# Patient Record
Sex: Female | Born: 1990 | Race: Black or African American | Hispanic: No | Marital: Single | Smoking: Never smoker
Health system: Southern US, Community
[De-identification: ages and names within clinical notes are randomized; demographics above are authoritative.]

## PROBLEM LIST (undated history)

## (undated) DIAGNOSIS — O139 Gestational [pregnancy-induced] hypertension without significant proteinuria, unspecified trimester: Secondary | ICD-10-CM

## (undated) DIAGNOSIS — J45909 Unspecified asthma, uncomplicated: Secondary | ICD-10-CM

## (undated) DIAGNOSIS — I10 Essential (primary) hypertension: Secondary | ICD-10-CM

## (undated) DIAGNOSIS — Z8619 Personal history of other infectious and parasitic diseases: Secondary | ICD-10-CM

## (undated) DIAGNOSIS — O24419 Gestational diabetes mellitus in pregnancy, unspecified control: Secondary | ICD-10-CM

## (undated) HISTORY — DX: Personal history of other infectious and parasitic diseases: Z86.19

## (undated) HISTORY — PX: JOINT REPLACEMENT: SHX530

## (undated) HISTORY — PX: KNEE SURGERY: SHX244

---

## 2012-07-14 ENCOUNTER — Emergency Department (INDEPENDENT_AMBULATORY_CARE_PROVIDER_SITE_OTHER)
Admission: EM | Admit: 2012-07-14 | Discharge: 2012-07-14 | Disposition: A | Discharge status: Home / Self Care | Payer: Medicaid Other | Source: Home / Self Care | Attending: Emergency Medicine | Admitting: Emergency Medicine

## 2012-07-14 ENCOUNTER — Encounter (HOSPITAL_COMMUNITY): Payer: Self-pay | Admitting: Emergency Medicine

## 2012-07-14 DIAGNOSIS — A749 Chlamydial infection, unspecified: Secondary | ICD-10-CM

## 2012-07-14 LAB — WET PREP, GENITAL
Clue Cells Wet Prep HPF POC: NONE SEEN
Trich, Wet Prep: NONE SEEN
Yeast Wet Prep HPF POC: NONE SEEN

## 2012-07-14 MED ORDER — AZITHROMYCIN 250 MG PO TABS
ORAL_TABLET | ORAL | Status: AC
  Filled 2012-07-14: qty 4

## 2012-07-14 MED ORDER — AZITHROMYCIN 250 MG PO TABS
1000.0000 mg | ORAL_TABLET | Freq: Every day | ORAL | Status: DC
  Administered 2012-07-14: 1000 mg via ORAL

## 2012-07-14 NOTE — ED Provider Notes (Signed)
History     CSN: [id number]  Arrival date & time 07/14/12  7829   First MD Initiated Contact with Patient 07/14/12 1836      Chief Complaint  Patient presents with  . Exposure to STD    (Consider location/radiation/quality/duration/timing/severity/associated sxs/prior treatment) HPI Comments: Patient presents urgent care this evening requesting to be treated for Chlamydia she was called by the STD clinic at the [facility] health Department reporting to her that she had a positive Chlamydia test. Patient has been experiencing a vaginal discharge which she has also been treated with MetroGel for a presumed bacterial vaginosis. Patient denies any pelvic pain, nausea vomiting or fevers. She denies any previous STDs  Patient is a 21 y.o. female presenting with STD exposure. The history is provided by the patient.  Exposure to STD This is a new problem. The problem occurs constantly. The problem has not changed since onset.Pertinent negatives include no chest pain, no abdominal pain, no headaches and no shortness of breath. Nothing aggravates the symptoms. Nothing relieves the symptoms. She has tried nothing for the symptoms.    History reviewed. No pertinent past medical history.  History reviewed. No pertinent past surgical history.  History reviewed. No pertinent family history.  History  Substance Use Topics  . Smoking status: Never Smoker   . Smokeless tobacco: Not on file  . Alcohol Use: No    OB History    Grav Para Term Preterm Abortions TAB SAB Ect Mult Living                  Review of Systems  Constitutional: Negative for chills, diaphoresis, activity change and appetite change.  Respiratory: Negative for shortness of breath.   Cardiovascular: Negative for chest pain.  Gastrointestinal: Negative for abdominal pain.  Genitourinary: Positive for vaginal discharge. Negative for dysuria, decreased urine volume and vaginal pain.  Skin: Negative for color change, rash and  wound.  Neurological: Negative for headaches.    Allergies  Review of patient's allergies indicates no known allergies.  Home Medications  No current outpatient prescriptions on file.  BP 151/90  Pulse 88  Temp 98.7 F (37.1 C) (Oral)  Resp 18  SpO2 100%  LMP 06/22/2012  Physical Exam  Nursing note and vitals reviewed. Constitutional: She appears well-developed and well-nourished.  Abdominal: Soft.  Genitourinary: There is no rash or tenderness on the right labia. There is no rash or tenderness on the left labia. No erythema or tenderness around the vagina. No foreign body around the vagina. No signs of injury around the vagina. Vaginal discharge found.  Musculoskeletal: She exhibits no tenderness.  Neurological: She is alert.  Skin: Skin is warm. No rash noted. No erythema.    ED Course  Procedures (including critical care time)   Labs Reviewed  WET PREP, GENITAL  GC/CHLAMYDIA PROBE AMP, GENITAL   No results found.   1. Chlamydia       MDM  Chlamydia positive results. Today have been treated with azithromycin 1 g. A DNA probe was obtained for thyroid Chlamydia screening.      Jimmie Molly, MD 07/14/12 2017

## 2012-07-14 NOTE — ED Notes (Addendum)
Exposure to std x 2 wks ago. Was seen at [facility] health department 2 wks ago and received a call informing her of a  Positive ct.  Pt states she needs treatment.

## 2012-07-15 LAB — GC/CHLAMYDIA PROBE AMP, GENITAL
Chlamydia, DNA Probe: POSITIVE — AB
GC Probe Amp, Genital: NEGATIVE

## 2012-07-18 ENCOUNTER — Telehealth (HOSPITAL_COMMUNITY): Payer: Self-pay | Admitting: *Deleted

## 2012-07-18 NOTE — ED Notes (Signed)
10/4  GC neg., Chlamydia pos., Wet prep: many WBC's.  Pt. adequately treated with Zithromax.  10/7 I called pt.  Pt. verified x 2 and given results.  Pt. told she was adequately treated.  Pt. instructed to notify her partner, no sex for 1 week and to practice safe sex. Pt. told she can get HIV testing at the [facility]. STD clinic by appointment.  DHHS form completed and faxed to the [facility]. Vassie Moselle 07/18/2012

## 2012-08-29 ENCOUNTER — Emergency Department (HOSPITAL_COMMUNITY)
Admission: EM | Admit: 2012-08-29 | Discharge: 2012-08-29 | Disposition: A | Discharge status: Home / Self Care | Payer: Self-pay | Attending: Emergency Medicine | Admitting: Emergency Medicine

## 2012-08-29 ENCOUNTER — Encounter (HOSPITAL_COMMUNITY): Payer: Self-pay | Admitting: Emergency Medicine

## 2012-08-29 DIAGNOSIS — Z711 Person with feared health complaint in whom no diagnosis is made: Secondary | ICD-10-CM

## 2012-08-29 DIAGNOSIS — N760 Acute vaginitis: Secondary | ICD-10-CM | POA: Insufficient documentation

## 2012-08-29 DIAGNOSIS — B9689 Other specified bacterial agents as the cause of diseases classified elsewhere: Secondary | ICD-10-CM | POA: Insufficient documentation

## 2012-08-29 LAB — URINALYSIS, ROUTINE W REFLEX MICROSCOPIC
Bilirubin Urine: NEGATIVE
Glucose, UA: NEGATIVE mg/dL
Hgb urine dipstick: NEGATIVE
Ketones, ur: NEGATIVE mg/dL
Nitrite: NEGATIVE
Protein, ur: 30 mg/dL — AB
Specific Gravity, Urine: 1.024 (ref 1.005–1.030)
Urobilinogen, UA: 1 mg/dL (ref 0.0–1.0)
pH: 8 (ref 5.0–8.0)

## 2012-08-29 LAB — WET PREP, GENITAL
Trich, Wet Prep: NONE SEEN
Yeast Wet Prep HPF POC: NONE SEEN

## 2012-08-29 LAB — URINE MICROSCOPIC-ADD ON

## 2012-08-29 LAB — POCT PREGNANCY, URINE: Preg Test, Ur: NEGATIVE

## 2012-08-29 MED ORDER — LIDOCAINE HCL 1 % IJ SOLN
INTRAMUSCULAR | Status: AC
  Administered 2012-08-29: 20 mL via INTRAMUSCULAR
  Filled 2012-08-29: qty 20

## 2012-08-29 MED ORDER — METRONIDAZOLE 500 MG PO TABS
2000.0000 mg | ORAL_TABLET | Freq: Once | ORAL | Status: AC
  Administered 2012-08-29: 2000 mg via ORAL
  Filled 2012-08-29: qty 4

## 2012-08-29 MED ORDER — AZITHROMYCIN 250 MG PO TABS
1000.0000 mg | ORAL_TABLET | Freq: Once | ORAL | Status: AC
  Administered 2012-08-29: 1000 mg via ORAL
  Filled 2012-08-29: qty 4

## 2012-08-29 MED ORDER — CEFTRIAXONE SODIUM 250 MG IJ SOLR
250.0000 mg | Freq: Once | INTRAMUSCULAR | Status: AC
Start: 2012-08-29 — End: 2012-08-29
  Administered 2012-08-29: 250 mg via INTRAMUSCULAR
  Filled 2012-08-29: qty 250

## 2012-08-29 NOTE — ED Provider Notes (Signed)
History     CSN: [id number]  Arrival date & time 08/29/12  1651   First MD Initiated Contact with Patient 08/29/12 1736      Chief Complaint  Patient presents with  . Vaginal Discharge    (Consider location/radiation/quality/duration/timing/severity/associated sxs/prior treatment) HPI  As the emergency department with complaints of vaginal discharge for 2 weeks that is whitish in color. She denies having any abdominal pain, fevers, lower back pain, vomiting. She admits to having unprotected sex with multiple partners and has not been using protection. nad vss  History reviewed. No pertinent past medical history.  History reviewed. No pertinent past surgical history.  No family history on file.  History  Substance Use Topics  . Smoking status: Never Smoker   . Smokeless tobacco: Not on file  . Alcohol Use: No    OB History    Grav Para Term Preterm Abortions TAB SAB Ect Mult Living                  Review of Systems  Review of Systems  Gen: no weight loss, fevers, chills, night sweats  Neck: no neck pain  Lungs:No wheezing, coughing or hemoptysis CV: no chest pain, palpitations, dependent edema or orthopnea  Abd: no abdominal pain, nausea, vomiting  GU: no dysuria or gross hematuria, + vaginal discharge MSK:  No abnormalities  Neuro: no headache, no focal neurologic deficits  Skin: no abnormalities Psyche: negative.   Allergies  Review of patient's allergies indicates no known allergies.  Home Medications  No current outpatient prescriptions on file.  BP 133/77  Pulse 85  Temp 98.6 F (37 C) (Oral)  Resp 12  SpO2 97%  LMP 08/14/2012  Physical Exam  Nursing note and vitals reviewed. Constitutional: She appears well-developed and well-nourished. No distress.  HENT:  Head: Normocephalic and atraumatic.  Eyes: Pupils are equal, round, and reactive to light.  Neck: Normal range of motion. Neck supple.  Cardiovascular: Normal rate and regular  rhythm.   Pulmonary/Chest: Effort normal.  Abdominal: Soft. She exhibits no distension. There is no tenderness. There is no rebound.  Genitourinary: Uterus normal. Cervix exhibits discharge. Cervix exhibits no motion tenderness and no friability. No erythema, tenderness or bleeding around the vagina. No foreign body around the vagina. No signs of injury around the vagina. Vaginal discharge found.  Neurological: She is alert.  Skin: Skin is warm and dry.    ED Course  Procedures (including critical care time)  Labs Reviewed  URINALYSIS, ROUTINE W REFLEX MICROSCOPIC - Abnormal; Notable for the following:    APPearance CLOUDY (*)     Protein, ur 30 (*)     Leukocytes, UA SMALL (*)     All other components within normal limits  WET PREP, GENITAL - Abnormal; Notable for the following:    Clue Cells Wet Prep HPF POC MODERATE (*)     WBC, Wet Prep HPF POC FEW (*)     All other components within normal limits  URINE MICROSCOPIC-ADD ON - Abnormal; Notable for the following:    Squamous Epithelial / LPF MANY (*)     Bacteria, UA FEW (*)     All other components within normal limits  POCT PREGNANCY, URINE  GC/CHLAMYDIA PROBE AMP  URINE CULTURE   No results found.   1. Concern about STD in female without diagnosis   2. BV (bacterial vaginosis)       MDM  No pain, positive clue cells. Given STD cocktail, rocephin, azithro and  flagyl 2mg  PO.  Womens clinic follow-up.  Safe sex education given. No concerning findings on physical exam.  Pt has been advised of the symptoms that warrant their return to the ED. Patient has voiced understanding and has agreed to follow-up with the PCP or specialist.         Dorthula Matas, PA 08/29/12 1904

## 2012-08-29 NOTE — ED Notes (Signed)
Pt presenting to ed with c/o vaginal discharge x 2 weeks with whitish colored discharge. Pt states she would like to get checked for STD's.

## 2012-08-29 NOTE — ED Provider Notes (Signed)
Medical screening examination/treatment/procedure(s) were performed by non-physician practitioner and as supervising physician I was immediately available for consultation/collaboration.    Nelia Shi, MD 08/29/12 [phone]

## 2012-08-30 LAB — URINE CULTURE
Colony Count: NO GROWTH
Culture: NO GROWTH

## 2012-08-30 LAB — GC/CHLAMYDIA PROBE AMP, GENITAL
Chlamydia, DNA Probe: NEGATIVE
GC Probe Amp, Genital: NEGATIVE

## 2013-02-20 ENCOUNTER — Emergency Department (INDEPENDENT_AMBULATORY_CARE_PROVIDER_SITE_OTHER)
Admission: EM | Admit: 2013-02-20 | Discharge: 2013-02-20 | Disposition: A | Discharge status: Home / Self Care | Payer: Medicaid Other | Source: Home / Self Care | Attending: Emergency Medicine | Admitting: Emergency Medicine

## 2013-02-20 ENCOUNTER — Encounter (HOSPITAL_COMMUNITY): Payer: Self-pay | Admitting: Emergency Medicine

## 2013-02-20 DIAGNOSIS — K625 Hemorrhage of anus and rectum: Secondary | ICD-10-CM

## 2013-02-20 LAB — CBC
HCT: 40.2 % (ref 36.0–46.0)
Hemoglobin: 13.6 g/dL (ref 12.0–15.0)
MCH: 29.5 pg (ref 26.0–34.0)
MCHC: 33.8 g/dL (ref 30.0–36.0)
MCV: 87.2 fL (ref 78.0–100.0)
Platelets: 301 10*3/uL (ref 150–400)
RBC: 4.61 MIL/uL (ref 3.87–5.11)
RDW: 12.9 % (ref 11.5–15.5)
WBC: 5.6 10*3/uL (ref 4.0–10.5)

## 2013-02-20 LAB — POCT I-STAT, CHEM 8
BUN: 7 mg/dL (ref 6–23)
Calcium, Ion: 1.23 mmol/L (ref 1.12–1.23)
Chloride: 102 mEq/L (ref 96–112)
Creatinine, Ser: 1 mg/dL (ref 0.50–1.10)
Glucose, Bld: 96 mg/dL (ref 70–99)
HCT: 44 % (ref 36.0–46.0)
Hemoglobin: 15 g/dL (ref 12.0–15.0)
Potassium: 3.6 mEq/L (ref 3.5–5.1)
Sodium: 140 mEq/L (ref 135–145)
TCO2: 28 mmol/L (ref 0–100)

## 2013-02-20 LAB — OCCULT BLOOD, POC DEVICE: Fecal Occult Bld: POSITIVE — AB

## 2013-02-20 MED ORDER — POLYETHYLENE GLYCOL 3350 17 G PO PACK: 17.0000 g | PACK | Freq: Every day | ORAL | Status: AC

## 2013-02-20 NOTE — ED Notes (Signed)
Pt c/o intermittent rectal bleeding onset September Reports she has this episodes when she's constipated; blood is usually in the toilet paper and not in the toilet Denies: f/v/n Has tried Milk of Magnesia and Miralax for discomfort  She is alert and oriented w/no signs of acute distress.

## 2013-02-20 NOTE — ED Provider Notes (Addendum)
History     CSN: [id number]  Arrival date & time 02/20/13  1232   First MD Initiated Contact with Patient 02/20/13 1315      Chief Complaint  Patient presents with  . Rectal Bleeding    (Consider location/radiation/quality/duration/timing/severity/associated sxs/prior treatment) HPI Comments: Patient presents to urgent care complaining of intermittent rectal bleeding since October of last year. Comes and goes sometimes associated with cramping pains or the sensation of abdominal distention, she describes that she usually has 2-3 episodes of liquidy stools and a course of a given week or 2. Sometimes she does see streaks of blood in it. She denies any mucoid material in it, patient denies any unintentional weight loss, fevers, urinary symptoms such as increased frequency burning or pressure. Denies any international trips. Patient denies having had a history of inflammatory bowel illness, have not perceive herself as having hemorrhoids, denies rectal pain or bleeding associated with bowel movements.  Patient is a 22 y.o. female presenting with hematochezia. The history is provided by the patient.  Rectal Bleeding  The current episode started more than 2 weeks ago. The problem occurs occasionally. The problem has been unchanged. The pain is mild. The stool is described as bloody, soft and mixed with blood. There was no prior successful therapy. Associated symptoms include abdominal pain, diarrhea and rectal pain. Pertinent negatives include no anorexia, no fever, no hematemesis, no hemorrhoids, no nausea, no vomiting, no hematuria, no vaginal discharge, no chest pain, no headaches, no coughing, no difficulty breathing and no rash. She has been eating and drinking normally. Her past medical history does not include recent abdominal injury. There were no sick contacts.    History reviewed. No pertinent past medical history.  History reviewed. No pertinent past surgical history.  History  reviewed. No pertinent family history.  History  Substance Use Topics  . Smoking status: Never Smoker   . Smokeless tobacco: Not on file  . Alcohol Use: No    OB History   Grav Para Term Preterm Abortions TAB SAB Ect Mult Living                  Review of Systems  Constitutional: Negative for fever, chills, activity change and appetite change.  Respiratory: Negative for cough.   Cardiovascular: Negative for chest pain.  Gastrointestinal: Positive for abdominal pain, diarrhea, constipation, blood in stool, hematochezia, anal bleeding and rectal pain. Negative for nausea, vomiting, abdominal distention, anorexia, hematemesis and hemorrhoids.  Genitourinary: Negative for dysuria, hematuria and vaginal discharge.  Musculoskeletal: Negative for myalgias, back pain and joint swelling.  Skin: Negative for color change, pallor, rash and wound.  Neurological: Negative for dizziness and headaches.  Hematological: Negative for adenopathy. Does not bruise/bleed easily.    Allergies  Review of patient's allergies indicates no known allergies.  Home Medications   Current Outpatient Rx  Name  Route  Sig  Dispense  Refill  . polyethylene glycol (MIRALAX / GLYCOLAX) packet   Oral   Take 17 g by mouth daily.   14 each   0     BP 156/92  Pulse 72  Temp(Src) 98 F (36.7 C) (Oral)  Resp 16  SpO2 100%  LMP 02/05/2013  Physical Exam  Constitutional: Vital signs are normal. She appears well-developed and well-nourished.  Non-toxic appearance. She does not have a sickly appearance. She does not appear ill. No distress.  HENT:  Head: Normocephalic.  Mouth/Throat: No oropharyngeal exudate.  Eyes: Conjunctivae are normal. No scleral icterus.  Neck:  Neck supple. No JVD present.  Abdominal: Soft. Bowel sounds are normal. She exhibits no shifting dullness, no distension, no pulsatile liver, no fluid wave, no abdominal bruit, no ascites, no pulsatile midline mass and no mass. There is no  hepatomegaly. There is no tenderness. There is no rigidity, no rebound, no guarding, no tenderness at McBurney's point and negative Murphy's sign. No hernia. Hernia confirmed negative in the ventral area.  Genitourinary: Rectal exam shows no external hemorrhoid, no internal hemorrhoid, no fissure, no mass, no tenderness and anal tone normal. Guaiac positive stool.  Rectal sphincter within normal. No obvious and no fissure, no external hemorrhoids. No palpable internal hemorrhoids. Hemoccult positive for stools and rectal vault-  Neurological: She is alert.  Skin: No rash noted. No erythema.    ED Course  Procedures (including critical care time)  Labs Reviewed  OCCULT BLOOD, POC DEVICE - Abnormal; Notable for the following:    Fecal Occult Bld POSITIVE (*)    All other components within normal limits  CBC  POCT I-STAT, CHEM 8   No results found.   1. Rectal bleeding    Normal H&H, normal creatinine.   MDM  Recurrent episodes of rectal bleeding associated with mild cramping. History of constipation. Patient with a soft normal abdominal exam. Guaiac-positive. No anemia. Prescribed MiraLax usage for the next week and also refer patient to see a gastroenterologist to rule out inflammatory bowel illness. Patient has no history of international travels to suspect of a intestinal parasitosis.        Jimmie Molly, MD 02/20/13 1433  Jimmie Molly, MD 02/20/13 1438

## 2013-03-01 ENCOUNTER — Encounter (HOSPITAL_COMMUNITY): Payer: Self-pay | Admitting: Adult Health

## 2013-03-01 ENCOUNTER — Emergency Department (HOSPITAL_COMMUNITY)
Admission: EM | Admit: 2013-03-01 | Discharge: 2013-03-01 | Disposition: A | Discharge status: Home / Self Care | Payer: Medicaid Other | Attending: Emergency Medicine | Admitting: Emergency Medicine

## 2013-03-01 DIAGNOSIS — K5900 Constipation, unspecified: Secondary | ICD-10-CM

## 2013-03-01 LAB — CBC WITH DIFFERENTIAL/PLATELET
Basophils Absolute: 0 10*3/uL (ref 0.0–0.1)
Basophils Relative: 0 % (ref 0–1)
Eosinophils Absolute: 0.2 10*3/uL (ref 0.0–0.7)
Eosinophils Relative: 4 % (ref 0–5)
HCT: 38.1 % (ref 36.0–46.0)
Hemoglobin: 13.3 g/dL (ref 12.0–15.0)
Lymphocytes Relative: 39 % (ref 12–46)
Lymphs Abs: 2.4 10*3/uL (ref 0.7–4.0)
MCH: 30.2 pg (ref 26.0–34.0)
MCHC: 34.9 g/dL (ref 30.0–36.0)
MCV: 86.4 fL (ref 78.0–100.0)
Monocytes Absolute: 0.5 10*3/uL (ref 0.1–1.0)
Monocytes Relative: 8 % (ref 3–12)
Neutro Abs: 3 10*3/uL (ref 1.7–7.7)
Neutrophils Relative %: 49 % (ref 43–77)
Platelets: 266 10*3/uL (ref 150–400)
RBC: 4.41 MIL/uL (ref 3.87–5.11)
RDW: 12.9 % (ref 11.5–15.5)
WBC: 6.1 10*3/uL (ref 4.0–10.5)

## 2013-03-01 LAB — BASIC METABOLIC PANEL
BUN: 8 mg/dL (ref 6–23)
CO2: 26 mEq/L (ref 19–32)
Calcium: 9 mg/dL (ref 8.4–10.5)
Chloride: 100 mEq/L (ref 96–112)
Creatinine, Ser: 0.76 mg/dL (ref 0.50–1.10)
GFR calc Af Amer: >90 mL/min (ref >90)
GFR calc non Af Amer: >90 mL/min (ref >90)
Glucose, Bld: 82 mg/dL (ref 70–99)
Potassium: 3.4 mEq/L — ABNORMAL LOW (ref 3.5–5.1)
Sodium: 136 mEq/L (ref 135–145)

## 2013-03-01 LAB — OCCULT BLOOD X 1 CARD TO LAB, STOOL: Fecal Occult Bld: NEGATIVE

## 2013-03-01 NOTE — ED Provider Notes (Signed)
History    This chart was scribed for non-physician practitioner, Roxy Horseman PA-C working with Vida Roller, MD by Donne Anon, ED Scribe. This patient was seen in room TR06C/TR06C and the patient's care was started at 1813.   CSN: [id number]  Arrival date & time 03/01/13  1707   First MD Initiated Contact with Patient 03/01/13 1813      Chief Complaint  Patient presents with  . Rectal Bleeding     The history is provided by the patient. No language interpreter was used.   HPI Comments: Rachel Neal is a 22 y.o. female who presents to the Emergency Department complaining of 9 months of gradual onset, gradually worsening, intermittent rectal bleeding. She was seen at Urgent Care on 02/20/13 for the same complaint where she was advised to use Miralax and return if the bleeding occurred again. She was referred to a GI physician but states she has not made an appointment yet. She reports red blood in tissue paper when wiping and constipation. Her last BM was this morning and was hard. She reports she drinks about 2 bottles of water per day. She denies hematochezia, blood in the toilet, abdominal pain or any other pain.    History reviewed. No pertinent past medical history.  History reviewed. No pertinent past surgical history.  History reviewed. No pertinent family history.  History  Substance Use Topics  . Smoking status: Never Smoker   . Smokeless tobacco: Not on file  . Alcohol Use: No    OB History   Grav Para Term Preterm Abortions TAB SAB Ect Mult Living                  Review of Systems  Gastrointestinal: Positive for constipation. Negative for abdominal pain.  All other systems reviewed and are negative.    Allergies  Review of patient's allergies indicates no known allergies.  Home Medications  No current outpatient prescriptions on file.  BP 152/93  Pulse 77  Temp(Src) 98.8 F (37.1 C) (Oral)  Resp 14  SpO2 100%  LMP 02/05/2013  Physical  Exam  Nursing note and vitals reviewed. Constitutional: She is oriented to person, place, and time. She appears well-developed and well-nourished. No distress.  HENT:  Head: Normocephalic and atraumatic.  Eyes: EOM are normal.  Neck: Neck supple. No tracheal deviation present.  Cardiovascular: Normal rate.   Pulmonary/Chest: Effort normal. No respiratory distress.  Abdominal: Soft. She exhibits no distension and no mass.  No focal abdominal tenderness. No distention or masses. No signs of acute or surgical abdomen.  Genitourinary:  Rectal exam performed with chaperone present. No hemmoroids. No gross blood. No fissures. No abscesses. Soft brown stool.  Musculoskeletal: Normal range of motion.  Neurological: She is alert and oriented to person, place, and time.  Skin: Skin is warm and dry.  Psychiatric: She has a normal mood and affect. Her behavior is normal.    ED Course  Procedures (including critical care time) DIAGNOSTIC STUDIES: Oxygen Saturation is 100% on room air, normal by my interpretation.    COORDINATION OF CARE: 7:26 PM Discussed treatment plan which includes labs and rectal exam with pt at bedside and pt agreed to plan.     Results for orders placed during the hospital encounter of 03/01/13  CBC WITH DIFFERENTIAL      Result Value Range   WBC 6.1  4.0 - 10.5 K/uL   RBC 4.41  3.87 - 5.11 MIL/uL   Hemoglobin 13.3  12.0 -  15.0 g/dL   HCT 40.9  81.1 - 91.4 %   MCV 86.4  78.0 - 100.0 fL   MCH 30.2  26.0 - 34.0 pg   MCHC 34.9  30.0 - 36.0 g/dL   RDW 78.2  95.6 - 21.3 %   Platelets 266  150 - 400 K/uL   Neutrophils Relative % 49  43 - 77 %   Neutro Abs 3.0  1.7 - 7.7 K/uL   Lymphocytes Relative 39  12 - 46 %   Lymphs Abs 2.4  0.7 - 4.0 K/uL   Monocytes Relative 8  3 - 12 %   Monocytes Absolute 0.5  0.1 - 1.0 K/uL   Eosinophils Relative 4  0 - 5 %   Eosinophils Absolute 0.2  0.0 - 0.7 K/uL   Basophils Relative 0  0 - 1 %   Basophils Absolute 0.0  0.0 - 0.1 K/uL   BASIC METABOLIC PANEL      Result Value Range   Sodium 136  135 - 145 mEq/L   Potassium 3.4 (*) 3.5 - 5.1 mEq/L   Chloride 100  96 - 112 mEq/L   CO2 26  19 - 32 mEq/L   Glucose, Bld 82  70 - 99 mg/dL   BUN 8  6 - 23 mg/dL   Creatinine, Ser 0.86  0.50 - 1.10 mg/dL   Calcium 9.0  8.4 - 57.8 mg/dL   GFR calc non Af Amer >90  >90 mL/min   GFR calc Af Amer >90  >90 mL/min  OCCULT BLOOD X 1 CARD TO LAB, STOOL      Result Value Range   Fecal Occult Bld NEGATIVE  NEGATIVE       1. Constipation       MDM  Patient complaining of rectal bleeding. There is no gross blood on my exam. Hemoccult is negative. Suspect that this is likely due to constipation, and she has seen blood on the toilet paper, but not in her stool or the toilet bowl. Blood counts are normal. Patient is stable for discharge. Followup with gastroenterology. Continue MiraLax, increase by mouth fluids.  I personally performed the services described in this documentation, which was scribed in my presence. The recorded information has been reviewed and is accurate.        Roxy Horseman, PA-C 03/01/13 2332

## 2013-03-01 NOTE — ED Notes (Signed)
Pt reports intermittent rectal bleeding since September. First seen on Monday at [facility] for same, sent home with Miralax. Reports minimal relief and reports continued constipation. BM today, but small amt of blood noted on tissue. Denies pain. Denies abdominal pain.

## 2013-03-01 NOTE — ED Notes (Signed)
C/o rectal bleeding intermittently for one year. She was seen at [facility] last week and given a follow up with the gastroenterologist. Was told to come to the ER if the bleeding occurred again or began having pain. Reports blood in tissue paper when wiping. Denies blood in stool, denies blood in commode. Denies pain.

## 2013-03-01 NOTE — ED Notes (Signed)
Pt dc to home. Pt sts understanding to dc instructions. Pt ambulatory to exit without difficulty. 

## 2013-03-02 NOTE — ED Provider Notes (Signed)
Medical screening examination/treatment/procedure(s) were performed by non-physician practitioner and as supervising physician I was immediately available for consultation/collaboration.    Romesha Scherer D Kenley Rettinger, MD 03/02/13 1347 

## 2013-04-12 ENCOUNTER — Encounter: Payer: Self-pay | Admitting: Internal Medicine

## 2013-05-08 ENCOUNTER — Encounter: Payer: Self-pay | Admitting: Internal Medicine

## 2013-05-10 ENCOUNTER — Ambulatory Visit (INDEPENDENT_AMBULATORY_CARE_PROVIDER_SITE_OTHER): Payer: Medicaid Other | Admitting: Internal Medicine

## 2013-05-10 ENCOUNTER — Encounter: Payer: Self-pay | Admitting: Internal Medicine

## 2013-05-10 VITALS — BP 116/62 | HR 96 | Ht 68.0 in | Wt 178.2 lb

## 2013-05-10 DIAGNOSIS — K625 Hemorrhage of anus and rectum: Secondary | ICD-10-CM

## 2013-05-10 DIAGNOSIS — K5900 Constipation, unspecified: Secondary | ICD-10-CM

## 2013-05-10 NOTE — Patient Instructions (Addendum)
Start taking a probiotic daily.  Call us if your rectal bleeding returns.  Follow up as needed                                               We are excited to introduce MyChart, a new best-in-class service that provides you online access to important information in your electronic medical record. We want to make it easier for you to view your health information - all in one secure location - when and where you need it. We expect MyChart will enhance the quality of care and service we provide.  When you register for MyChart, you can:    View your test results.    Request appointments and receive appointment reminders via email.    Request medication renewals.    View your medical history, allergies, medications and immunizations.    Communicate with your physician's office through a password-protected site.    Conveniently print information such as your medication lists.  To find out if MyChart is right for you, please talk to a member of our clinical staff today. We will gladly answer your questions about this free health and wellness tool.  If you are age 75 or older and want a member of your family to have access to your record, you must provide written consent by completing a proxy form available at our office. Please speak to our clinical staff about guidelines regarding accounts for patients younger than age 37.  As you activate your MyChart account and need any technical assistance, please call the MyChart technical support line at [phone]) or email your question to [email or web address]. If you email your question(s), please include your name, a return phone number and the best time to reach you.  If you have non-urgent health-related questions, you can send a message to our office through MyChart at [email or web address]. If you have a medical emergency, call 911.  Thank you for using MyChart as your new health and wellness resource!   MyChart  licensed from [employer],  [id number]. Patents Pending.

## 2013-05-10 NOTE — Progress Notes (Signed)
Patient ID: Rachel Neal, female   DOB: Sep 29, 1991, 22 y.o.   MRN: [id number] HPI: [name] is a 22 yo female with little PMH who is seen in consultation at the request of Lorne Skeens, RN (APN) from the [facility] Dept for evaluation of intermittent rectal bleeding. The patient reports developing bright red blood per rectum associated with constipation straining approximately 2 months ago. She reports seeing rectal bleeding sporadically over the past year. It has always been associated with constipation and hard stool. She was evaluated in May 2014 in the emergency room for rectal bleeding. At that time she was having hard stool and the feeling of incomplete evacuation. She did take MiraLax 17 g daily for about 2 weeks but when it ran out she stopped the medication. She reports resolution of her rectal bleeding in the last 2 months. She thinks the last time she saw any blood in her stool was over a month ago. She reports her bowel movements have returned to regular and she associates this with less stressors and improve diet. She was reporting one formed bowel movement a day. No diarrhea or constipation. No further rectal bleeding or melena. No further feeling of incomplete evacuation. No abdominal pain. No family history of colorectal cancer, IBD.  Past Medical History  Diagnosis Date  . Hx of chlamydia infection     History reviewed. No pertinent past surgical history.  No current outpatient prescriptions on file.   No current facility-administered medications for this visit.    No Known Allergies  Family History  Problem Relation Age of Onset  . High blood pressure Brother   . High blood pressure Sister   . Heart attack Father   . Diabetes Mother   . Diabetes Brother   . Asthma Father   . Breast cancer Maternal Aunt   . Colon polyps Sister   . Bone cancer Maternal Aunt     History  Substance Use Topics  . Smoking status: Never Smoker   . Smokeless tobacco: Never Used   . Alcohol Use: No    ROS: As per history of present illness, otherwise negative  BP 116/62  Pulse 96  Ht 5\' 8"  (1.727 m)  Wt 178 lb 4 oz (80.854 kg)  BMI 27.11 kg/m2  LMP 04/29/2013 Constitutional: Well-developed and well-nourished. No distress. HEENT: Normocephalic and atraumatic. Oropharynx is clear and moist. No oropharyngeal exudate. Conjunctivae are normal.  No scleral icterus. Neck: Neck supple. Trachea midline. Cardiovascular: Normal rate, regular rhythm and intact distal pulses. No M/R/G Pulmonary/chest: Effort normal and breath sounds normal. No wheezing, rales or rhonchi. Abdominal: Soft, nontender, nondistended. Bowel sounds active throughout. There are no masses palpable. No hepatosplenomegaly. Extremities: no clubbing, cyanosis, or edema Lymphadenopathy: No cervical adenopathy noted. Neurological: Alert and oriented to person place and time. Skin: Skin is warm and dry. No rashes noted. Psychiatric: Normal mood and affect. Behavior is normal.  RELEVANT LABS AND IMAGING: CBC    Component Value Date/Time   WBC 6.1 03/01/2013 1924   RBC 4.41 03/01/2013 1924   HGB 13.3 03/01/2013 1924   HCT 38.1 03/01/2013 1924   PLT 266 03/01/2013 1924   MCV 86.4 03/01/2013 1924   MCH 30.2 03/01/2013 1924   MCHC 34.9 03/01/2013 1924   RDW 12.9 03/01/2013 1924   LYMPHSABS 2.4 03/01/2013 1924   MONOABS 0.5 03/01/2013 1924   EOSABS 0.2 03/01/2013 1924   BASOSABS 0.0 03/01/2013 1924    CMP     Component Value Date/Time  NA 136 03/01/2013 1924   K 3.4* 03/01/2013 1924   CL 100 03/01/2013 1924   CO2 26 03/01/2013 1924   GLUCOSE 82 03/01/2013 1924   BUN 8 03/01/2013 1924   CREATININE 0.76 03/01/2013 1924   CALCIUM 9.0 03/01/2013 1924   GFRNONAA >90 03/01/2013 1924   GFRAA >90 03/01/2013 1924    ASSESSMENT/PLAN: 22 yo female with little PMH who is seen in consultation at the request of Lorne Skeens, RN (APN) from the [facility] Dept for evaluation of intermittent rectal bleeding.  1.   Rectal bleeding, now resolved, hx of constipation -- her bowel habits are completely normal now with no rectal bleeding. Blood test done about 2 months ago were also reassuring with no evidence of anemia or microcytosis. I very much expect her bleeding is anorectal in nature, possibly secondary to hemorrhoid or even fissure. Now that her bowel habits are normal without diarrhea or constipation she is having no bleeding and no complaint. We discussed options including monitoring for recurrent bleeding we'll proceed to flexible sigmoidoscopy. Flexible sigmoidoscopy is very likely low yield at this time. We decided for her to monitor her bowel habits and for recurrent bleeding. Should her bowel habits change or she develop rectal bleeding, I would recommend flexible sigmoidoscopy at that time. She is happy with this plan. I have recommended a daily probiotic should her stools tend to constipation. She voices understanding to be seen as needed.

## 2013-07-10 ENCOUNTER — Emergency Department (INDEPENDENT_AMBULATORY_CARE_PROVIDER_SITE_OTHER)
Admission: EM | Admit: 2013-07-10 | Discharge: 2013-07-10 | Disposition: A | Discharge status: Home / Self Care | Payer: Medicaid Other | Source: Home / Self Care | Attending: Family Medicine | Admitting: Family Medicine

## 2013-07-10 ENCOUNTER — Encounter (HOSPITAL_COMMUNITY): Payer: Self-pay

## 2013-07-10 DIAGNOSIS — Z043 Encounter for examination and observation following other accident: Secondary | ICD-10-CM

## 2013-07-10 MED ORDER — CYCLOBENZAPRINE HCL 5 MG PO TABS: 5.0000 mg | ORAL_TABLET | Freq: Three times a day (TID) | ORAL | Status: DC | PRN
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
## 2013-07-30 ENCOUNTER — Emergency Department (HOSPITAL_COMMUNITY)
Admission: EM | Admit: 2013-07-30 | Discharge: 2013-07-30 | Disposition: A | Discharge status: Home / Self Care | Payer: Medicaid Other | Attending: Emergency Medicine | Admitting: Emergency Medicine

## 2013-07-30 ENCOUNTER — Emergency Department (HOSPITAL_COMMUNITY): Payer: Medicaid Other

## 2013-07-30 ENCOUNTER — Encounter (HOSPITAL_COMMUNITY): Payer: Self-pay | Admitting: Emergency Medicine

## 2013-07-30 DIAGNOSIS — Z8619 Personal history of other infectious and parasitic diseases: Secondary | ICD-10-CM | POA: Insufficient documentation

## 2013-07-30 DIAGNOSIS — Y939 Activity, unspecified: Secondary | ICD-10-CM | POA: Insufficient documentation

## 2013-07-30 DIAGNOSIS — W2209XA Striking against other stationary object, initial encounter: Secondary | ICD-10-CM | POA: Insufficient documentation

## 2013-07-30 DIAGNOSIS — J45909 Unspecified asthma, uncomplicated: Secondary | ICD-10-CM | POA: Insufficient documentation

## 2013-07-30 DIAGNOSIS — S8992XA Unspecified injury of left lower leg, initial encounter: Secondary | ICD-10-CM

## 2013-07-30 DIAGNOSIS — S8990XA Unspecified injury of unspecified lower leg, initial encounter: Secondary | ICD-10-CM | POA: Insufficient documentation

## 2013-07-30 DIAGNOSIS — Y929 Unspecified place or not applicable: Secondary | ICD-10-CM | POA: Insufficient documentation

## 2013-07-30 HISTORY — DX: Unspecified asthma, uncomplicated: J45.909

## 2013-07-30 MED ORDER — TRAMADOL HCL 50 MG PO TABS
50.0000 mg | ORAL_TABLET | Freq: Once | ORAL | Status: AC
  Administered 2013-07-30: 50 mg via ORAL
  Filled 2013-07-30: qty 1

## 2013-07-30 MED ORDER — TRAMADOL HCL 50 MG PO TABS: 50.0000 mg | ORAL_TABLET | Freq: Four times a day (QID) | ORAL | Status: DC | PRN

## 2013-07-30 MED ORDER — NAPROXEN 500 MG PO TABS: 500.0000 mg | ORAL_TABLET | Freq: Two times a day (BID) | ORAL | Status: DC

## 2013-07-30 NOTE — ED Notes (Signed)
Patient returned from X-ray 

## 2013-07-30 NOTE — ED Notes (Signed)
Pt reports left leg pain and swelling onset last pm.
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
## 2013-07-30 NOTE — ED Provider Notes (Signed)
Medical screening examination/treatment/procedure(s) were performed by non-physician practitioner and as supervising physician I was immediately available for consultation/collaboration.   Ashleyanne Hemmingway, MD 07/30/13 2327 

## 2013-07-30 NOTE — ED Notes (Signed)
Patient transported to X-ray 

## 2015-11-24 ENCOUNTER — Emergency Department (HOSPITAL_COMMUNITY)
Admission: EM | Admit: 2015-11-24 | Discharge: 2015-11-24 | Disposition: A | Discharge status: Home / Self Care | Payer: No Typology Code available for payment source | Attending: Emergency Medicine | Admitting: Emergency Medicine

## 2015-11-24 ENCOUNTER — Encounter (HOSPITAL_COMMUNITY): Payer: Self-pay | Admitting: Emergency Medicine

## 2015-11-24 DIAGNOSIS — S0990XA Unspecified injury of head, initial encounter: Secondary | ICD-10-CM | POA: Diagnosis not present

## 2015-11-24 DIAGNOSIS — S4992XA Unspecified injury of left shoulder and upper arm, initial encounter: Secondary | ICD-10-CM | POA: Diagnosis not present

## 2015-11-24 DIAGNOSIS — Z79899 Other long term (current) drug therapy: Secondary | ICD-10-CM | POA: Insufficient documentation

## 2015-11-24 DIAGNOSIS — Y9389 Activity, other specified: Secondary | ICD-10-CM | POA: Insufficient documentation

## 2015-11-24 DIAGNOSIS — Z791 Long term (current) use of non-steroidal anti-inflammatories (NSAID): Secondary | ICD-10-CM | POA: Insufficient documentation

## 2015-11-24 DIAGNOSIS — S134XXA Sprain of ligaments of cervical spine, initial encounter: Secondary | ICD-10-CM | POA: Diagnosis not present

## 2015-11-24 DIAGNOSIS — Y92410 Unspecified street and highway as the place of occurrence of the external cause: Secondary | ICD-10-CM | POA: Diagnosis not present

## 2015-11-24 DIAGNOSIS — Y998 Other external cause status: Secondary | ICD-10-CM | POA: Diagnosis not present

## 2015-11-24 DIAGNOSIS — Z8719 Personal history of other diseases of the digestive system: Secondary | ICD-10-CM | POA: Diagnosis not present

## 2015-11-24 DIAGNOSIS — S29002A Unspecified injury of muscle and tendon of back wall of thorax, initial encounter: Secondary | ICD-10-CM | POA: Diagnosis not present

## 2015-11-24 DIAGNOSIS — S199XXA Unspecified injury of neck, initial encounter: Secondary | ICD-10-CM | POA: Diagnosis present

## 2015-11-24 MED ORDER — IBUPROFEN 600 MG PO TABS: 600.0000 mg | ORAL_TABLET | Freq: Four times a day (QID) | ORAL | Status: DC | PRN

## 2015-11-24 MED ORDER — CYCLOBENZAPRINE HCL 5 MG PO TABS: 5.0000 mg | ORAL_TABLET | Freq: Two times a day (BID) | ORAL | Status: DC | PRN

## 2015-11-24 MED ORDER — IBUPROFEN 800 MG PO TABS
800.0000 mg | ORAL_TABLET | Freq: Once | ORAL | Status: AC
  Administered 2015-11-24: 800 mg via ORAL
  Filled 2015-11-24: qty 1

## 2015-11-24 MED ORDER — CYCLOBENZAPRINE HCL 10 MG PO TABS
5.0000 mg | ORAL_TABLET | Freq: Once | ORAL | Status: AC
  Administered 2015-11-24: 5 mg via ORAL
  Filled 2015-11-24: qty 1
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
## 2016-01-31 ENCOUNTER — Encounter (HOSPITAL_COMMUNITY): Payer: Self-pay | Admitting: Oncology

## 2016-01-31 ENCOUNTER — Emergency Department (HOSPITAL_COMMUNITY)
Admission: EM | Admit: 2016-01-31 | Discharge: 2016-02-01 | Disposition: A | Discharge status: Home / Self Care | Payer: Medicaid Other | Attending: Emergency Medicine | Admitting: Emergency Medicine

## 2016-01-31 ENCOUNTER — Emergency Department (HOSPITAL_COMMUNITY): Payer: No Typology Code available for payment source

## 2016-01-31 DIAGNOSIS — J45901 Unspecified asthma with (acute) exacerbation: Secondary | ICD-10-CM | POA: Insufficient documentation

## 2016-01-31 DIAGNOSIS — Z8619 Personal history of other infectious and parasitic diseases: Secondary | ICD-10-CM | POA: Insufficient documentation

## 2016-01-31 DIAGNOSIS — Z79899 Other long term (current) drug therapy: Secondary | ICD-10-CM | POA: Insufficient documentation

## 2016-01-31 MED ORDER — ALBUTEROL SULFATE (2.5 MG/3ML) 0.083% IN NEBU
5.0000 mg | INHALATION_SOLUTION | Freq: Once | RESPIRATORY_TRACT | Status: AC
  Administered 2016-01-31: 5 mg via RESPIRATORY_TRACT
  Filled 2016-01-31: qty 6

## 2016-01-31 MED ORDER — PREDNISONE 20 MG PO TABS
40.0000 mg | ORAL_TABLET | Freq: Once | ORAL | Status: AC
  Administered 2016-01-31: 40 mg via ORAL
  Filled 2016-01-31: qty 2

## 2016-01-31 NOTE — ED Notes (Signed)
Pt c/o Shob, chest tightness since 2000 this evening.  Pt is speaking in full sentences. Denies chest pain however states her chest feels tight.  States she has not had an asthma exacerbation x 1 year.  Lung sounds are diminished.  Pt has a productive cough, has not looked at sputum.

## 2016-01-31 NOTE — ED Notes (Signed)
Patient states that after breathing treatment, chest "feels better".  On assessment of lung sounds this RN does not detect an expiratory wheeze

## 2016-01-31 NOTE — ED Provider Notes (Signed)
CSN: [id number]     Arrival date & time 01/31/16  2209 History   First MD Initiated Contact with Patient 01/31/16 2248     Chief Complaint  Patient presents with  . Asthma    HPI   Rachel Neal is an 25 y.o. female with history of asthma who presents to the ED for evaluation of shortness of breath that she suspects is an asthma exacerbation. She states that she grew up with asthma that was usually triggered by exercise or weather changes. She states that she has not had an asthma attack in "a long time" but states today she was watching TV around 8 PM when she suddenly started her chest start tightening up. She states the sensation felt identical to asthma exacerbations in the past. She states she no longer has a rescue inhaler at home. She denies chest pain or feeling faint/lightheaded. She was given an albuterol neb in triage and states she now feels well and back to baseline. Denies new swelling, recent travel, OCP use, tobacco use.  Past Medical History  Diagnosis Date  . Hx of chlamydia infection   . Asthma    History reviewed. No pertinent past surgical history. Family History  Problem Relation Age of Onset  . High blood pressure Brother   . High blood pressure Sister   . Heart attack Father   . Diabetes Mother   . Diabetes Brother   . Asthma Father   . Breast cancer Maternal Aunt   . Colon polyps Sister   . Bone cancer Maternal Aunt    Social History  Substance Use Topics  . Smoking status: Never Smoker   . Smokeless tobacco: Never Used  . Alcohol Use: Yes     Comment: occ   OB History    No data available     Review of Systems  All other systems reviewed and are negative.     Allergies  Review of patient's allergies indicates no known allergies.  Home Medications   Prior to Admission medications   Medication Sig Start Date End Date Taking? Authorizing Provider  albuterol (PROVENTIL HFA;VENTOLIN HFA) 108 (90 BASE) MCG/ACT inhaler Inhale 2 puffs into the  lungs every 6 (six) hours as needed for wheezing.    Historical Provider, MD  cyclobenzaprine (FLEXERIL) 5 MG tablet Take 1 tablet (5 mg total) by mouth 2 (two) times daily as needed for muscle spasms. Patient not taking: Reported on 01/31/2016 11/24/15   Marlon Peliffany Greene, PA-C  ibuprofen (ADVIL,MOTRIN) 600 MG tablet Take 1 tablet (600 mg total) by mouth every 6 (six) hours as needed. Patient not taking: Reported on 01/31/2016 11/24/15   Marlon Peliffany Greene, PA-C   BP 146/103 mmHg  Pulse 96  Temp(Src) 98.4 F (36.9 C) (Oral)  Resp 18  SpO2 98%  LMP 01/07/2016 (Exact Date) Physical Exam  Constitutional: She is oriented to person, place, and time.  HENT:  Right Ear: External ear normal.  Left Ear: External ear normal.  Nose: Nose normal.  Mouth/Throat: Oropharynx is clear and moist. No oropharyngeal exudate.  Eyes: Conjunctivae and EOM are normal. Pupils are equal, round, and reactive to light.  Neck: Normal range of motion. Neck supple.  Cardiovascular: Normal rate, regular rhythm, normal heart sounds and intact distal pulses.   Pulmonary/Chest: Effort normal and breath sounds normal. No respiratory distress. She has no wheezes. She has no rales.  Abdominal: Soft. Bowel sounds are normal. She exhibits no distension. There is no tenderness.  Musculoskeletal: She exhibits no  edema.  Neurological: She is alert and oriented to person, place, and time. No cranial nerve deficit.  Skin: Skin is warm and dry.  Psychiatric: She has a normal mood and affect.  Nursing note and vitals reviewed.   ED Course  Procedures (including critical care time) Labs Review Labs Reviewed - No data to display  Imaging Review Dg Chest 2 View  01/31/2016  CLINICAL DATA:  Short of breath, productive cough EXAM: CHEST  2 VIEW COMPARISON:  None. FINDINGS: Normal cardiothymic silhouette. Airways normal. There is mild coarsened central bronchovascular markings. No focal consolidation. No osseous abnormality. No  pneumothorax. IMPRESSION: Findings suggest viral bronchiolitis.  No focal consolidation. Electronically Signed   By: Genevive Bi M.D.   On: 01/31/2016 23:34   I have personally reviewed and evaluated these images and lab results as part of my medical decision-making.   EKG Interpretation None      MDM   Final diagnoses:  Asthma exacerbation    Pt now without wheezing and is breathing comfortably. No hypoxia. CXR shows e/o possible viral bronchiolitis otherwise no focal consolidation. Discussed findings with pt. Rx given for albuterol rescue inhaler and few days of prednisone. Resource guide given to establish pcp for f/u. ER return precautions given.    Carlene Coria, PA-C 02/01/16 4098  Lorre Nick, MD 02/03/16 [phone]

## 2016-02-01 MED ORDER — PREDNISONE 20 MG PO TABS: 40.0000 mg | ORAL_TABLET | Freq: Every day | ORAL | Status: DC

## 2016-02-01 MED ORDER — ALBUTEROL SULFATE HFA 108 (90 BASE) MCG/ACT IN AERS: 1.0000 | INHALATION_SPRAY | Freq: Four times a day (QID) | RESPIRATORY_TRACT | Status: DC | PRN

## 2016-02-01 NOTE — ED Notes (Signed)
Patient d/c'd self care.  F/U and medications discussed.  Patient verbalized understanding. 

## 2016-02-01 NOTE — Discharge Instructions (Signed)
Take medications as prescribed. Return to the emergency room for worsening condition or new concerning symptoms. Follow up with your regular doctor. If you don't have a regular doctor use one of the numbers below to establish a primary care doctor. ° ° °Emergency Department Resource Guide °1) Find a Doctor and Pay Out of Pocket °Although you won't have to find out who is covered by your insurance plan, it is a good idea to ask around and get recommendations. You will then need to call the office and see if the doctor you have chosen will accept you as a new patient and what types of options they offer for patients who are self-pay. Some doctors offer discounts or will set up payment plans for their patients who do not have insurance, but you will need to ask so you aren't surprised when you get to your appointment. ° °2) Contact Your Local Health Department °Not all health departments have doctors that can see patients for sick visits, but many do, so it is worth a call to see if yours does. If you don't know where your local health department is, you can check in your phone book. The CDC also has a tool to help you locate your state's health department, and many state websites also have listings of all of their local health departments. ° °3) Find a Walk-in Clinic °If your illness is not likely to be very severe or complicated, you may want to try a walk in clinic. These are popping up all over the country in pharmacies, drugstores, and shopping centers. They're usually staffed by nurse practitioners or physician assistants that have been trained to treat common illnesses and complaints. They're usually fairly quick and inexpensive. However, if you have serious medical issues or chronic medical problems, these are probably not your best option. ° °No Primary Care Doctor: °- Call [facility] at  [phone] - they can help you locate a primary care doctor that  accepts your insurance, provides certain services,  etc. °- Physician Referral Service- 1-800-533-3463 ° °Emergency Department Resource Guide °1) Find a Doctor and Pay Out of Pocket °Although you won't have to find out who is covered by your insurance plan, it is a good idea to ask around and get recommendations. You will then need to call the office and see if the doctor you have chosen will accept you as a new patient and what types of options they offer for patients who are self-pay. Some doctors offer discounts or will set up payment plans for their patients who do not have insurance, but you will need to ask so you aren't surprised when you get to your appointment. ° °2) Contact Your Local Health Department °Not all health departments have doctors that can see patients for sick visits, but many do, so it is worth a call to see if yours does. If you don't know where your local health department is, you can check in your phone book. The CDC also has a tool to help you locate your state's health department, and many state websites also have listings of all of their local health departments. ° °3) Find a Walk-in Clinic °If your illness is not likely to be very severe or complicated, you may want to try a walk in clinic. These are popping up all over the country in pharmacies, drugstores, and shopping centers. They're usually staffed by nurse practitioners or physician assistants that have been trained to treat common illnesses and complaints. They're usually fairly   quick and inexpensive. However, if you have serious medical issues or chronic medical problems, these are probably not your best option. ° °No Primary Care Doctor: °- Call [facility] at  [phone] - they can help you locate a primary care doctor that  accepts your insurance, provides certain services, etc. °- Physician Referral Service- 1-800-533-3463 ° °Chronic Pain Problems: °Organization         Address  Phone   Notes  °[facility]  ([phone] Patients need to be referred by  their primary care doctor.  ° °Medication Assistance: °Organization         Address  Phone   Notes  °[place] [facility] [address] °[place], [place] [address] [phone]) [phone] --Must be a resident of [place] °-- Must have NO insurance coverage whatsoever (no Medicaid/ Medicare, etc.) °-- The pt. MUST have a primary care doctor that directs their care regularly and follows them in the community °  °[facility]  (866) 331-1348   °[employer]  (888) 892-1162   ° °Agencies that provide inexpensive medical care: °Organization         Address  Phone   Notes  °[facility]  ([phone]) [phone]   °[facility]    ([phone]) [phone]   °[facility] [address] °[place], [place] [address] [phone]) [phone]   °[facility] [address], °[place] ([phone]) [phone]   °[facility]    ([phone]) 373-0678   °[facility]    ([phone]) 272-1050   °[facility] ° [address], [place] Phone:  (336) [phone], Fax:  ([phone] Hours of Operation:  9 am - 6 pm, M-F.  Also accepts Medicaid/Medicare and self-pay.  °[facility] ° [address], [place] Phone: ([phone]) [phone], Fax: ([phone]. Hours of Operation:  8:30 am - 5:30 pm, M-F.  Also accepts Medicaid and self-pay.  °[facility] [place] [address], [place] Phone: ([phone]) [phone]   °[facility] [address], [place], [place] (336)[phone], Ext. 123 Mondays & Thursdays: 7-9 AM.  First 15 patients are seen on a first come, first serve basis. °  ° °Medicaid-accepting [place] Providers: ° °Organization         Address  Phone   Notes  °[facility] [address], [place] [phone] Also accepts self-pay patients.  °[facility] [address], [place] ° ([phone]) [phone]   °[facility]  [address], [place] ([phone]) [phone]   °[facility] [address] [place] [address], [place] [phone]) [phone]   °[facility] [address], [place]  ° ([phone]) [phone] Only accepts [place] Access Medicaid patients after they have their name applied to their card.  ° °Self-Pay (no insurance) in [place]: ° °Organization         Address  Phone   Notes  °Sickle Cell Patients, [facility] [address], [place] ([phone]) [phone]   °[facility] [address],  ([phone]) [phone]   °[facility] ° [address], [place] [phone]) [phone]   °[facility]/Dr. Osei-Bonsu ° [address] [place] [address],  or [address], [place] [phone] Phone number   for both [place] and [place] locations is the same.  °[facility] [address], [place] [phone]   °[facility] [address] [place] [address], [place] or [address] ([phone]) [phone] °([phone]) [phone]   °[facility] [address], [place] ([phone]) [phone], phone; ([phone]) [phone], fax Sees patients 1st and 3rd Saturday of every month.  Must not qualify for public or private insurance (i.e. Medicaid, Medicare, [place] Health Choice, Veterans' Benefits) • Household income should be no more than 200% of the poverty level •The clinic cannot treat you if you are pregnant or think you are pregnant • Sexually transmitted diseases are not treated at the clinic.  ° ° ° °

## 2017-03-20 IMAGING — CR DG CHEST 2V
2 series · 2 of 2 positions shown · non-contrast
Comparison: None.

CLINICAL DATA: Short of breath, productive cough

EXAM:
CHEST  2 VIEW

[w chest pa]
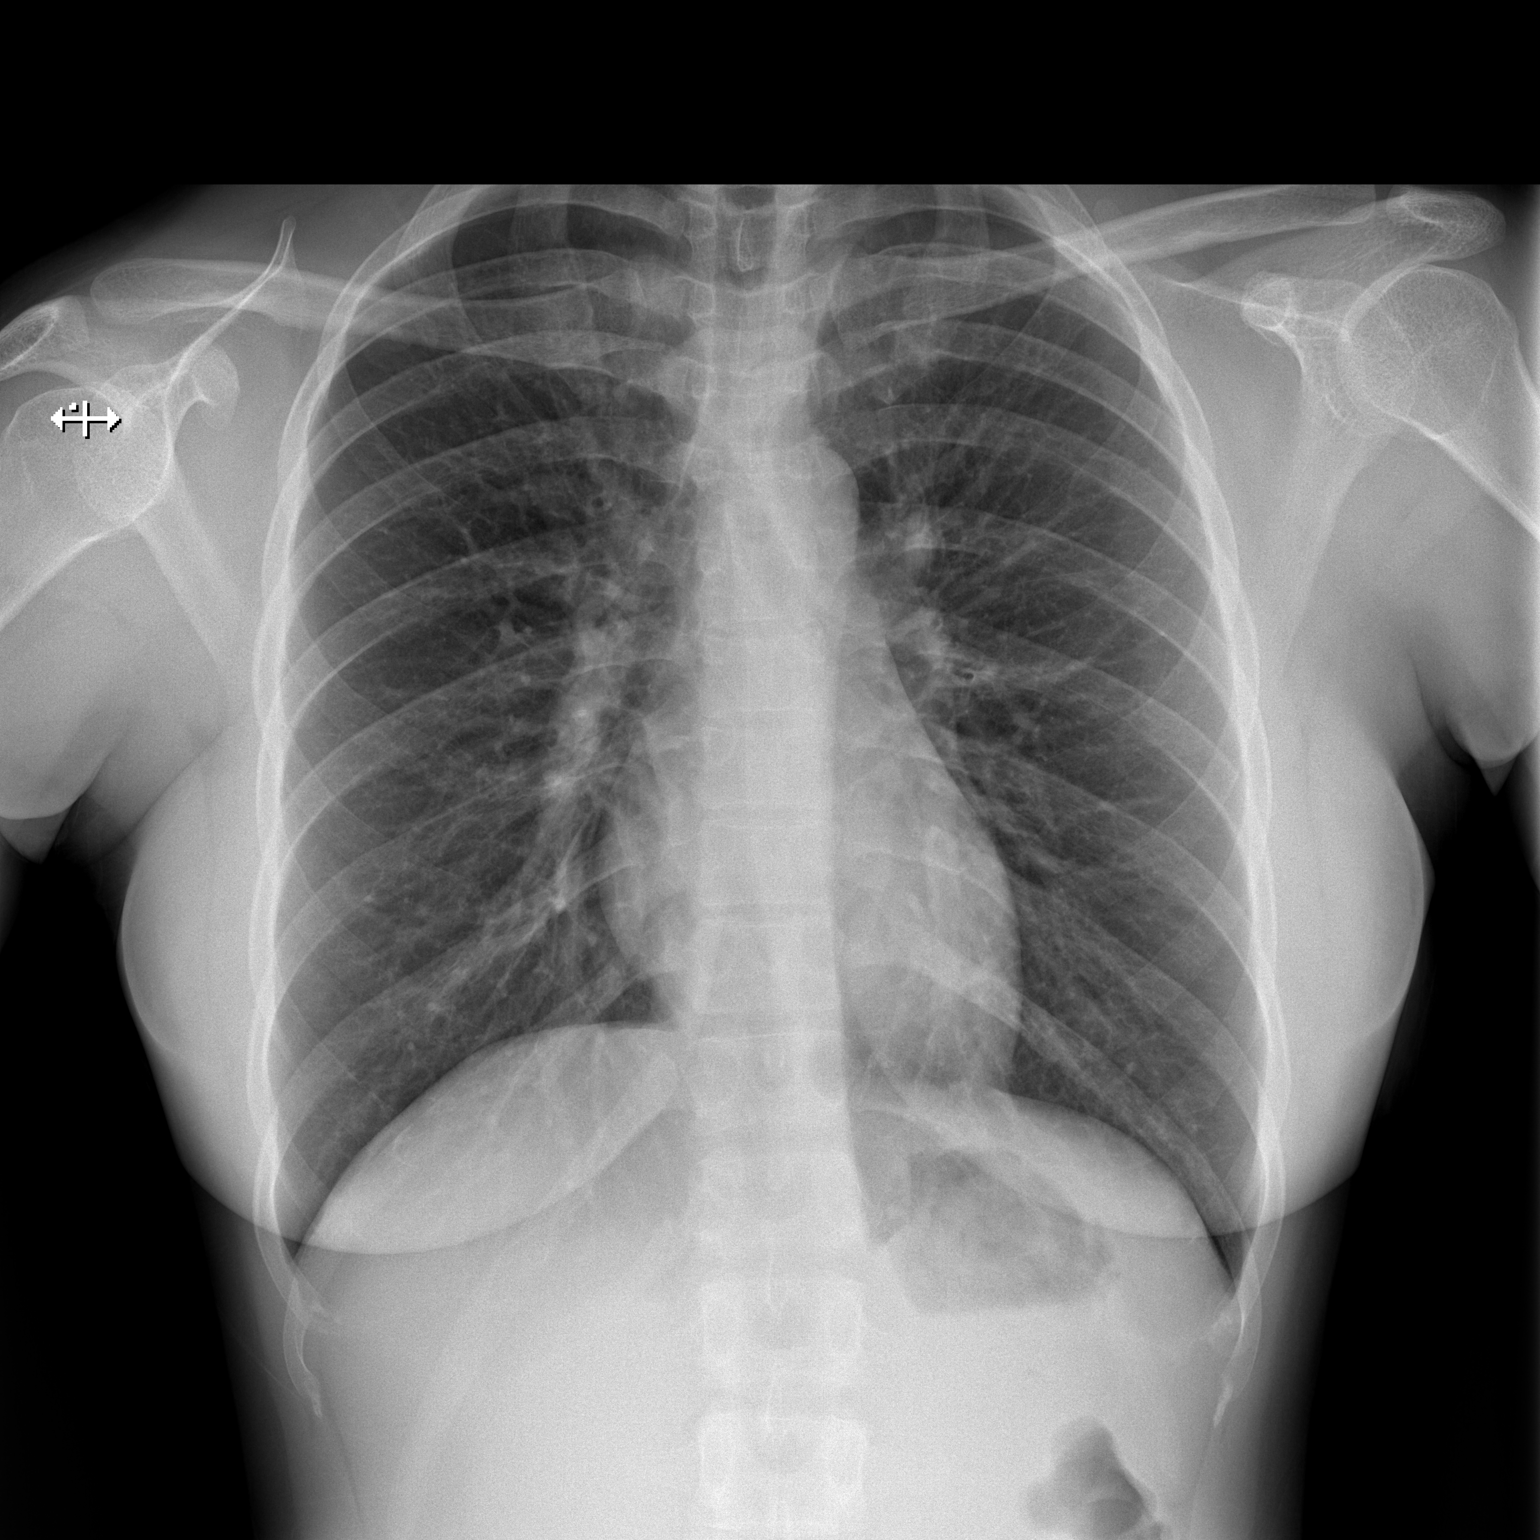

[w chest lat]
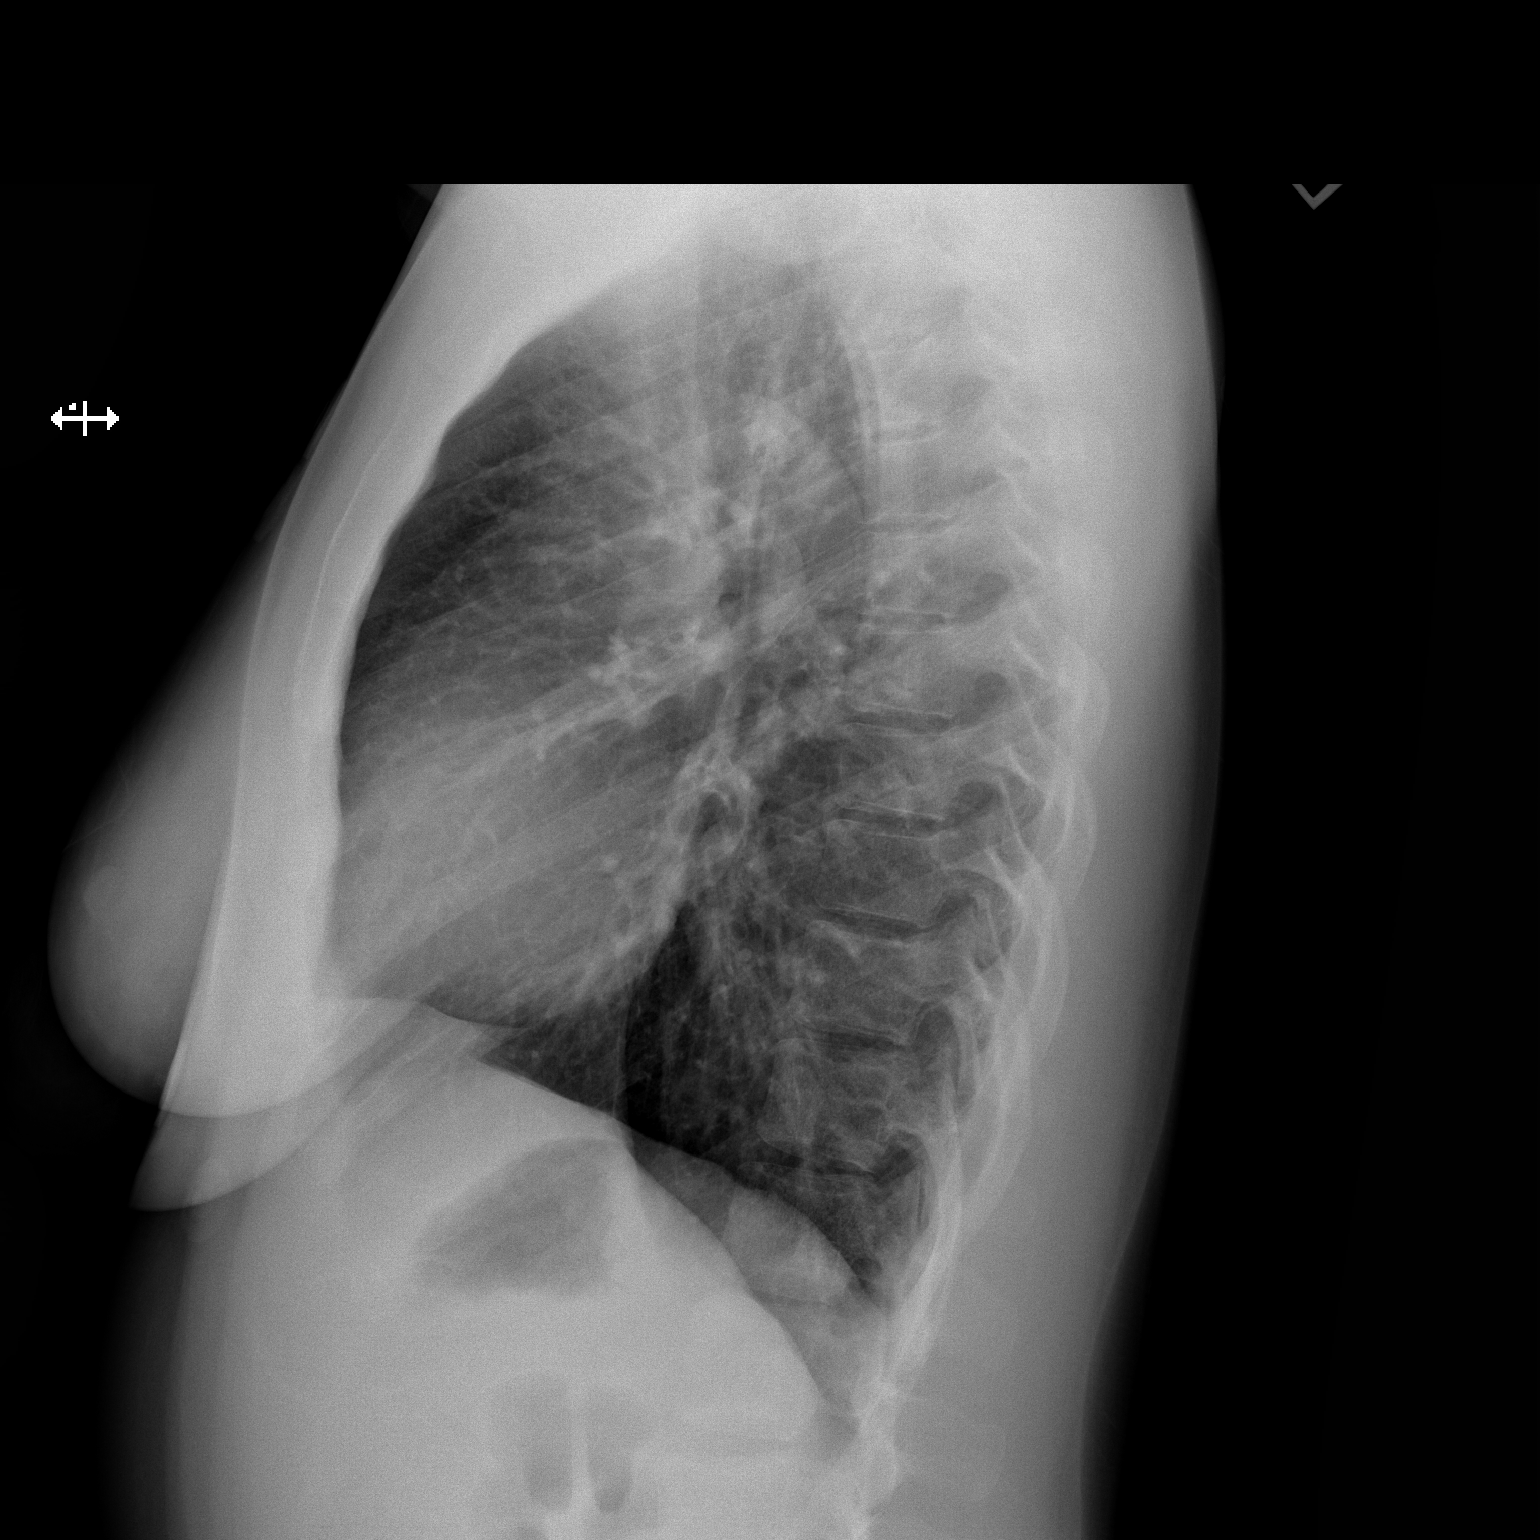

[2 of 2 positions shown; findings below may reference images not displayed]

FINDINGS: Normal cardiothymic silhouette. Airways normal. There is mild
coarsened central bronchovascular markings. No focal consolidation.
No osseous abnormality. No pneumothorax.
IMPRESSION: Findings suggest viral bronchiolitis.  No focal consolidation.
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
## 2019-12-07 ENCOUNTER — Telehealth: Payer: Self-pay

## 2019-12-07 NOTE — Telephone Encounter (Signed)
  Called patient x3, she hang up the phone x2 and it went to VM on 3rd attempt.  I left VM message to call Office to start her appt.

## 2019-12-14 ENCOUNTER — Other Ambulatory Visit (HOSPITAL_COMMUNITY)
Admission: RE | Admit: 2019-12-14 | Discharge: 2019-12-14 | Disposition: A | Discharge status: Home / Self Care | Payer: Medicaid Other | Source: Ambulatory Visit | Attending: Certified Nurse Midwife | Admitting: Certified Nurse Midwife

## 2019-12-14 ENCOUNTER — Other Ambulatory Visit: Payer: Self-pay

## 2019-12-14 ENCOUNTER — Encounter: Payer: Self-pay | Admitting: Certified Nurse Midwife

## 2019-12-14 ENCOUNTER — Ambulatory Visit (INDEPENDENT_AMBULATORY_CARE_PROVIDER_SITE_OTHER): Payer: Medicaid Other | Admitting: Certified Nurse Midwife

## 2019-12-14 VITALS — Wt 202.0 lb

## 2019-12-14 DIAGNOSIS — J45909 Unspecified asthma, uncomplicated: Secondary | ICD-10-CM

## 2019-12-14 DIAGNOSIS — O99519 Diseases of the respiratory system complicating pregnancy, unspecified trimester: Secondary | ICD-10-CM

## 2019-12-14 DIAGNOSIS — O99511 Diseases of the respiratory system complicating pregnancy, first trimester: Secondary | ICD-10-CM

## 2019-12-14 DIAGNOSIS — Z3481 Encounter for supervision of other normal pregnancy, first trimester: Secondary | ICD-10-CM

## 2019-12-14 DIAGNOSIS — Z3400 Encounter for supervision of normal first pregnancy, unspecified trimester: Secondary | ICD-10-CM | POA: Insufficient documentation

## 2019-12-14 DIAGNOSIS — B9689 Other specified bacterial agents as the cause of diseases classified elsewhere: Secondary | ICD-10-CM

## 2019-12-14 DIAGNOSIS — Z3480 Encounter for supervision of other normal pregnancy, unspecified trimester: Secondary | ICD-10-CM

## 2019-12-14 DIAGNOSIS — N760 Acute vaginitis: Secondary | ICD-10-CM

## 2019-12-14 DIAGNOSIS — Z3A12 12 weeks gestation of pregnancy: Secondary | ICD-10-CM

## 2019-12-14 MED ORDER — BLOOD PRESSURE KIT: 1.0000 | PACK | Freq: Once | 0 refills | Status: DC

## 2019-12-14 MED ORDER — ALBUTEROL SULFATE HFA 108 (90 BASE) MCG/ACT IN AERS: 2.0000 | INHALATION_SPRAY | Freq: Four times a day (QID) | RESPIRATORY_TRACT | 0 refills | Status: AC | PRN

## 2019-12-14 NOTE — Progress Notes (Signed)
 History:   Rachel Neal is a 29 y.o. G2P0010 at [redacted]w[redacted]d by LMP being seen today for her first obstetrical visit.  Her obstetrical history is significant for nothing. Patient does intend to breast feed. Pregnancy history fully reviewed.  Patient reports no complaints.     HISTORY: OB History  Gravida Para Term Preterm AB Living  2 0 0 0 1 0  SAB TAB Ectopic Multiple Live Births  0 1 0 0 0    # Outcome Date GA Lbr Len/2nd Weight Sex Delivery Anes PTL Lv  2 Current           1 TAB              Past Medical History:  Diagnosis Date  . Asthma   . Hx of chlamydia infection    Past Surgical History:  Procedure Laterality Date  . KNEE SURGERY Left    Family History  Problem Relation Age of Onset  . High blood pressure Brother   . High blood pressure Sister   . Heart attack Father   . Asthma Father   . Diabetes Mother   . Diabetes Brother   . Breast cancer Maternal Aunt   . Colon polyps Sister   . Bone cancer Maternal Aunt    Social History   Tobacco Use  . Smoking status: Never Smoker  . Smokeless tobacco: Never Used  Substance Use Topics  . Alcohol use: Yes    Comment: occ  . Drug use: Yes    Types: Marijuana   No Known Allergies Current Outpatient Medications on File Prior to Visit  Medication Sig Dispense Refill  . cyclobenzaprine (FLEXERIL) 5 MG tablet Take 1 tablet (5 mg total) by mouth 2 (two) times daily as needed for muscle spasms. (Patient not taking: Reported on 01/31/2016) 20 tablet 0  . ibuprofen (ADVIL,MOTRIN) 600 MG tablet Take 1 tablet (600 mg total) by mouth every 6 (six) hours as needed. (Patient not taking: Reported on 01/31/2016) 30 tablet 0  . predniSONE (DELTASONE) 20 MG tablet Take 2 tablets (40 mg total) by mouth daily. 8 tablet 0   No current facility-administered medications on file prior to visit.    Review of Systems Pertinent items noted in HPI and remainder of comprehensive ROS otherwise negative. Physical Exam:   Vitals:   12/14/19 1025  Weight: 202 lb (91.6 kg)   Fetal Heart Rate (bpm): 160 Pelvic Exam: Perineum: no hemorrhoids, normal perineum   Vulva: normal external genitalia, no lesions   Vagina:  normal mucosa, small amount of white thin normal discharge without odor   Cervix: no lesions and normal, pap smear done.    Adnexa: normal adnexa and no mass, fullness, tenderness   Bony Pelvis: average  System: General: well-developed, well-nourished female in no acute distress   Breasts:  normal appearance, no masses or tenderness bilaterally   Skin: normal coloration and turgor, no rashes   Neurologic: oriented, normal, negative, normal mood   Extremities: normal strength, tone, and muscle mass, ROM of all joints is normal   HEENT PERRLA, extraocular movement intact and sclera clear, anicteric   Mouth/Teeth mucous membranes moist, pharynx normal without lesions and dental hygiene good   Neck supple and no masses   Cardiovascular: regular rate and rhythm   Respiratory:  no respiratory distress, normal breath sounds   Abdomen: soft, non-tender; bowel sounds normal; no masses,  no organomegaly     Assessment:    Pregnancy: G2P0010 Patient Active Problem List     Diagnosis Date Noted  . Supervision of other normal pregnancy, antepartum 12/14/2019  . Asthma affecting pregnancy, antepartum 12/14/2019     Plan:    1. Supervision of other normal pregnancy, antepartum - Welcomed to practice and introduced self to patient  - Reviewed safety, visitor policy, reassurance about COVID-19 for pregnancy at this time. Discussed possible changes to visits, including televisits, that may occur due to COVID-19.  The office remains open if pt needs to be seen and [facility] is open 24 hours/day for OB emergencies. - patient interested in waterbirth, educated and discussed waterbirth and contraindications - anticipatory guidance on upcoming appointments including mychart appointment   - Obstetric Panel, Including HIV -  Cytology - PAP - Genetic Screening - Blood Pressure KIT; 1 kit by Does not apply route once for 1 dose.  Dispense: 1 kit; Refill: 0 - Babyscripts Schedule Optimization - Culture, OB Urine - Hepatitis C Antibody - US MFM OB COMP + 14 WK; Future - HgB A1c  2. Asthma affecting pregnancy, antepartum - patient reports no use of inhaler in over 2 years  - Rx for inhaler sent to pharmacy as pregnancy can often exacerbate asthma - albuterol (VENTOLIN HFA) 108 (90 Base) MCG/ACT inhaler; Inhale 2 puffs into the lungs every 6 (six) hours as needed for wheezing.  Dispense: 8 g; Refill: 0   Initial labs drawn. Continue prenatal vitamins. Genetic Screening discussed, NIPS: ordered. Ultrasound discussed; fetal anatomic survey: ordered. Problem list reviewed and updated. The nature of [facility] with multiple MDs and other Advanced Practice Providers was explained to patient; also emphasized that residents, students are part of our team. Routine obstetric precautions reviewed. Return in about 4 weeks (around 01/11/2020) for ROB-mychart.      Veronica C Rogers, CNM [facility]   

## 2019-12-14 NOTE — Patient Instructions (Addendum)
Safe Medications in Pregnancy   Acne: Benzoyl Peroxide Salicylic Acid  Backache/Headache: Tylenol: 2 regular strength every 4 hours OR              2 Extra strength every 6 hours  Colds/Coughs/Allergies: Benadryl (alcohol free) 25 mg every 6 hours as needed Breath right strips Claritin Cepacol throat lozenges Chloraseptic throat spray Cold-Eeze- up to three times per day Cough drops, alcohol free Flonase (by prescription only) Guaifenesin Mucinex Robitussin DM (plain only, alcohol free) Saline nasal spray/drops Sudafed (pseudoephedrine) & Actifed ** use only after [redacted] weeks gestation and if you do not have high blood pressure Tylenol Vicks Vaporub Zinc lozenges Zyrtec   Constipation: Colace Ducolax suppositories Fleet enema Glycerin suppositories Metamucil Milk of magnesia Miralax Senokot Smooth move tea  Diarrhea: Kaopectate Imodium A-D  *NO pepto Bismol  Hemorrhoids: Anusol Anusol HC Preparation H Tucks  Indigestion: Tums Maalox Mylanta Zantac  Pepcid  Insomnia: Benadryl (alcohol free) '25mg'$  every 6 hours as needed Tylenol PM Unisom, no Gelcaps  Leg Cramps: Tums MagGel  Nausea/Vomiting:  Bonine Dramamine Emetrol Ginger extract Sea bands Meclizine  Nausea medication to take during pregnancy:  Unisom (doxylamine succinate 25 mg tablets) Take one tablet daily at bedtime. If symptoms are not adequately controlled, the dose can be increased to a maximum recommended dose of two tablets daily (1/2 tablet in the morning, 1/2 tablet mid-afternoon and one at bedtime). Vitamin B6 '100mg'$  tablets. Take one tablet twice a day (up to 200 mg per day).  Skin Rashes: Aveeno products Benadryl cream or '25mg'$  every 6 hours as needed Calamine Lotion 1% cortisone cream  Yeast infection: Gyne-lotrimin 7 Monistat 7  **If taking multiple medications, please check labels to avoid duplicating the same active ingredients **take medication as directed on the  label ** Do not exceed 4000 mg of tylenol in 24 hours **Do not take medications that contain aspirin or ibuprofen  Considering Waterbirth? Guide for patients at [facility] [employer] [facility]  Why consider waterbirth?  . Gentle birth for babies . Less pain medicine used in labor . May allow for passive descent/less pushing . May reduce perineal tears  . More mobility and instinctive maternal position changes . Increased maternal relaxation . Reduced blood pressure in labor  Is waterbirth safe? What are the risks of infection, drowning or other complications?  . Infection: o Very low risk (3.7 % for tub vs 4.8% for bed) o 7 in 8000 waterbirths with documented infection o Poorly cleaned equipment most common cause o Slightly lower group B strep transmission rate  . Drowning o Maternal:  - Very low risk   - Related to seizures or fainting o Newborn:  - Very low risk. No evidence of increased risk of respiratory problems in multiple large studies - Physiological protection from breathing under water - Avoid underwater birth if there are any fetal complications - Once baby's head is out of the water, keep it out.  . Birth complication o Some reports of cord trauma, but risk decreased by bringing baby to surface gradually o No evidence of increased risk of shoulder dystocia. Mothers can usually change positions faster in water than in a bed, possibly aiding the maneuvers to free the shoulder.   You must attend a Doren Custard class at [facility]  3rd Wednesday of every month from 7-9pm  Free  [employer] by calling [phone] or online at [email or web address]  Bring [place] the certificate from the class to your prenatal appointment  Meet with a midwife at  36 weeks to see if you can still plan a waterbirth and to sign the consent.   If you plan a waterbirth at [facility] at [facility], you will need to purchase the following:  Fish  net  Bathing suit top (optional)  Long-handled mirror (optional)  Places to purchase or rent supplies:   GotWeb[employer].is for tub purchases and supplies  Waterbirthsolutions.com for tub purchases and supplies  [employer] (www.thelaborladies.com) $275 for tub rental/set-up & take down/kit   [employer] (http://www.fleming.com/.htm) Information regarding doulas (labor support) who provide pool rentals  Things that would prevent you from having a waterbirth:  Premature, <37wks  Previous cesarean birth  Presence of thick meconium-stained fluid  Multiple gestation (Twins, triplets, etc.)  Uncontrolled diabetes or gestational diabetes requiring medication  Hypertension requiring medication or diagnosis of pre-eclampsia  Heavy vaginal bleeding  Non-reassuring fetal heart rate  Active infection (MRSA, etc.). Group B Strep is NOT a contraindication for waterbirth.  If your labor has to be induced and induction method requires continuous monitoring of the baby's heart rate  Other risks/issues identified by your obstetrical provider  Please remember that birth is unpredictable. Under certain unforeseeable circumstances your provider may advise against giving birth in the tub. These decisions will be made on a case-by-case basis and with the safety of you and your baby as our highest priority.   Second Trimester of Pregnancy  The second trimester is from week 14 through week 27 (month 4 through 6). This is often the time in pregnancy that you feel your best. Often times, morning sickness has lessened or quit. You may have more energy, and you may get hungry more often. Your unborn baby is growing rapidly. At the end of the sixth month, he or she is about 9 inches long and weighs about 1 pounds. You will likely feel the baby move between 18 and 20 weeks of pregnancy. Follow these instructions at home: Medicines  Take over-the-counter and  prescription medicines only as told by your doctor. Some medicines are safe and some medicines are not safe during pregnancy.  Take a prenatal vitamin that contains at least 600 micrograms (mcg) of folic acid.  If you have trouble pooping (constipation), take medicine that will make your stool soft (stool softener) if your doctor approves. Eating and drinking   Eat regular, healthy meals.  Avoid raw meat and uncooked cheese.  If you get low calcium from the food you eat, talk to your doctor about taking a daily calcium supplement.  Avoid foods that are high in fat and sugars, such as fried and sweet foods.  If you feel sick to your stomach (nauseous) or throw up (vomit): ? Eat 4 or 5 small meals a day instead of 3 large meals. ? Try eating a few soda crackers. ? Drink liquids between meals instead of during meals.  To prevent constipation: ? Eat foods that are high in fiber, like fresh fruits and vegetables, whole grains, and beans. ? Drink enough fluids to keep your pee (urine) clear or pale yellow. Activity  Exercise only as told by your doctor. Stop exercising if you start to have cramps.  Do not exercise if it is too hot, too humid, or if you are in a place of great height (high altitude).  Avoid heavy lifting.  Wear low-heeled shoes. Sit and stand up straight.  You can continue to have sex unless your doctor tells you not to. Relieving pain and  discomfort  Wear a good support bra if your breasts are tender.  Take warm water baths (sitz baths) to soothe pain or discomfort caused by hemorrhoids. Use hemorrhoid cream if your doctor approves.  Rest with your legs raised if you have leg cramps or low back pain.  If you develop puffy, bulging veins (varicose veins) in your legs: ? Wear support hose or compression stockings as told by your doctor. ? Raise (elevate) your feet for 15 minutes, 3-4 times a day. ? Limit salt in your food. Prenatal care  Write down your  questions. Take them to your prenatal visits.  Keep all your prenatal visits as told by your doctor. This is important. Safety  Wear your seat belt when driving.  Make a list of emergency phone numbers, including numbers for family, friends, the hospital, and police and fire departments. General instructions  Ask your doctor about the right foods to eat or for help finding a counselor, if you need these services.  Ask your doctor about local prenatal classes. Begin classes before month 6 of your pregnancy.  Do not use hot tubs, steam rooms, or saunas.  Do not douche or use tampons or scented sanitary pads.  Do not cross your legs for long periods of time.  Visit your dentist if you have not done so. Use a soft toothbrush to brush your teeth. Floss gently.  Avoid all smoking, herbs, and alcohol. Avoid drugs that are not approved by your doctor.  Do not use any products that contain nicotine or tobacco, such as cigarettes and e-cigarettes. If you need help quitting, ask your doctor.  Avoid cat litter boxes and soil used by cats. These carry germs that can cause birth defects in the baby and can cause a loss of your baby (miscarriage) or stillbirth. Contact a doctor if:  You have mild cramps or pressure in your lower belly.  You have pain when you pee (urinate).  You have bad smelling fluid coming from your vagina.  You continue to feel sick to your stomach (nauseous), throw up (vomit), or have watery poop (diarrhea).  You have a nagging pain in your belly area.  You feel dizzy. Get help right away if:  You have a fever.  You are leaking fluid from your vagina.  You have spotting or bleeding from your vagina.  You have severe belly cramping or pain.  You lose or gain weight rapidly.  You have trouble catching your breath and have chest pain.  You notice sudden or extreme puffiness (swelling) of your face, hands, ankles, feet, or legs.  You have not felt the baby  move in over an hour.  You have severe headaches that do not go away when you take medicine.  You have trouble seeing. Summary  The second trimester is from week 14 through week 27 (months 4 through 6). This is often the time in pregnancy that you feel your best.  To take care of yourself and your unborn baby, you will need to eat healthy meals, take medicines only if your doctor tells you to do so, and do activities that are safe for you and your baby.  Call your doctor if you get sick or if you notice anything unusual about your pregnancy. Also, call your doctor if you need help with the right food to eat, or if you want to know what activities are safe for you. This information is not intended to replace advice given to you by your health  care provider. Make sure you discuss any questions you have with your health care provider. Document Revised: 01/20/2019 Document Reviewed: 11/03/2016 Elsevier Patient Education  [place].

## 2019-12-15 ENCOUNTER — Other Ambulatory Visit (HOSPITAL_COMMUNITY)
Admission: RE | Admit: 2019-12-15 | Discharge: 2019-12-15 | Disposition: A | Discharge status: Home / Self Care | Payer: Medicaid Other | Source: Ambulatory Visit | Attending: Certified Nurse Midwife | Admitting: Certified Nurse Midwife

## 2019-12-15 DIAGNOSIS — Z3480 Encounter for supervision of other normal pregnancy, unspecified trimester: Secondary | ICD-10-CM | POA: Diagnosis present

## 2019-12-15 LAB — OBSTETRIC PANEL, INCLUDING HIV
Antibody Screen: NEGATIVE
Basophils Absolute: 0 10*3/uL (ref 0.0–0.2)
Basos: 0 %
EOS (ABSOLUTE): 0.1 10*3/uL (ref 0.0–0.4)
Eos: 2 %
HIV Screen 4th Generation wRfx: NONREACTIVE
Hematocrit: 41.2 % (ref 34.0–46.6)
Hemoglobin: 13.6 g/dL (ref 11.1–15.9)
Hepatitis B Surface Ag: NEGATIVE
Immature Grans (Abs): 0 10*3/uL (ref 0.0–0.1)
Immature Granulocytes: 0 %
Lymphocytes Absolute: 1.7 10*3/uL (ref 0.7–3.1)
Lymphs: 30 %
MCH: 30 pg (ref 26.6–33.0)
MCHC: 33 g/dL (ref 31.5–35.7)
MCV: 91 fL (ref 79–97)
Monocytes Absolute: 0.3 10*3/uL (ref 0.1–0.9)
Monocytes: 6 %
Neutrophils Absolute: 3.6 10*3/uL (ref 1.4–7.0)
Neutrophils: 62 %
Platelets: 313 10*3/uL (ref 150–450)
RBC: 4.53 x10E6/uL (ref 3.77–5.28)
RDW: 13.4 % (ref 11.7–15.4)
RPR Ser Ql: NONREACTIVE
Rh Factor: POSITIVE
Rubella Antibodies, IGG: 5.18 index (ref >0.99)
WBC: 5.7 10*3/uL (ref 3.4–10.8)

## 2019-12-15 LAB — HEPATITIS C ANTIBODY: Hep C Virus Ab: <0.1 s/co ratio (ref 0.0–0.9)

## 2019-12-15 LAB — HEMOGLOBIN A1C
Est. average glucose Bld gHb Est-mCnc: 111 mg/dL
Hgb A1c MFr Bld: 5.5 % (ref 4.8–5.6)

## 2019-12-15 NOTE — Addendum Note (Signed)
Addended by: Thom Chimes on: 12/15/2019 08:46 AM   Modules accepted: Orders

## 2019-12-16 LAB — CULTURE, OB URINE

## 2019-12-16 LAB — URINE CULTURE, OB REFLEX

## 2019-12-16 NOTE — Addendum Note (Signed)
Addended by: Sharyon Cable on: 12/16/2019 08:21 AM   Modules accepted: Orders

## 2019-12-18 LAB — CERVICOVAGINAL ANCILLARY ONLY
Bacterial Vaginitis (gardnerella): POSITIVE — AB
Candida Glabrata: NEGATIVE
Candida Vaginitis: NEGATIVE
Chlamydia: NEGATIVE
Comment: NEGATIVE
Comment: NEGATIVE
Comment: NEGATIVE
Comment: NEGATIVE
Comment: NEGATIVE
Comment: NORMAL
Neisseria Gonorrhea: NEGATIVE
Trichomonas: NEGATIVE

## 2019-12-18 MED ORDER — METRONIDAZOLE 500 MG PO TABS: 500.0000 mg | ORAL_TABLET | Freq: Two times a day (BID) | ORAL | 0 refills | Status: DC

## 2019-12-18 NOTE — Addendum Note (Signed)
Addended by: Sharyon Cable on: 12/18/2019 01:25 PM   Modules accepted: Orders

## 2019-12-19 ENCOUNTER — Telehealth: Payer: Self-pay

## 2019-12-19 NOTE — Telephone Encounter (Signed)
Attempted to contact about results and rx sent, no answer, left vm. 

## 2019-12-19 NOTE — Telephone Encounter (Signed)
S/w patient and advised of results and rx sent  ?

## 2019-12-25 ENCOUNTER — Encounter: Payer: Self-pay | Admitting: Certified Nurse Midwife

## 2019-12-27 ENCOUNTER — Encounter: Payer: Self-pay | Admitting: Certified Nurse Midwife

## 2019-12-28 ENCOUNTER — Other Ambulatory Visit: Payer: Self-pay

## 2019-12-28 DIAGNOSIS — O99519 Diseases of the respiratory system complicating pregnancy, unspecified trimester: Secondary | ICD-10-CM

## 2019-12-28 DIAGNOSIS — J45909 Unspecified asthma, uncomplicated: Secondary | ICD-10-CM

## 2019-12-28 DIAGNOSIS — Z3480 Encounter for supervision of other normal pregnancy, unspecified trimester: Secondary | ICD-10-CM

## 2019-12-28 MED ORDER — BLOOD PRESSURE KIT: 1.0000 | PACK | Freq: Once | 0 refills | Status: AC

## 2020-01-11 ENCOUNTER — Telehealth (INDEPENDENT_AMBULATORY_CARE_PROVIDER_SITE_OTHER): Payer: Medicaid Other | Admitting: Nurse Practitioner

## 2020-01-11 ENCOUNTER — Encounter: Payer: Self-pay | Admitting: Nurse Practitioner

## 2020-01-11 VITALS — BP 126/73 | HR 82

## 2020-01-11 DIAGNOSIS — Z3A16 16 weeks gestation of pregnancy: Secondary | ICD-10-CM

## 2020-01-11 DIAGNOSIS — O219 Vomiting of pregnancy, unspecified: Secondary | ICD-10-CM

## 2020-01-11 DIAGNOSIS — Z3480 Encounter for supervision of other normal pregnancy, unspecified trimester: Secondary | ICD-10-CM

## 2020-01-11 NOTE — Progress Notes (Signed)
I connected with@ on 01/11/20 at  1:40 PM EDT by: MyChart and verified that I am speaking with the correct person using two identifiers.  Patient is located at home and provider is located at [facility].     The purpose of this virtual visit is to provide medical care while limiting exposure to the novel coronavirus. I discussed the limitations, risks, security and privacy concerns of performing an evaluation and management service by MyChart and the availability of in person appointments. I also discussed with the patient that there may be a patient responsible charge related to this service. By engaging in this virtual visit, you consent to the provision of healthcare.  Additionally, you authorize for your insurance to be billed for the services provided during this visit.  The patient expressed understanding and agreed to proceed.  The following staff members participated in the virtual visit:  Margaretmary Eddy, CMA and Nolene Bernheim, NP    PRENATAL VISIT NOTE  Subjective:  Rachel Neal is a 29 y.o. G2P0010 at [redacted]w[redacted]d  for phone visit for ongoing prenatal care.  She is currently monitored for the following issues for this low-risk pregnancy and has Supervision of other normal pregnancy, antepartum and Asthma affecting pregnancy, antepartum on their problem list.  Patient reports no complaints.  Contractions: Not present. Vag. Bleeding: None.  Movement: Present. Denies leaking of fluid.   The following portions of the patient's history were reviewed and updated as appropriate: allergies, current medications, past family history, past medical history, past social history, past surgical history and problem list.   Objective:   Vitals:   01/11/20 1354  BP: 126/73  Pulse: 82   Self-Obtained  Fetal Status:     Movement: Present     Assessment and Plan:  Pregnancy: G2P0010 at [redacted]w[redacted]d 1. Supervision of other normal pregnancy, antepartum Discussed that not yet time to feel baby's movement. Is not  having problems with asthma - has not used inhaler. Advised to get babyscripts app and record BP weekly. Advised where to find info about childbirth classes and pediatricians in Babyscripts app.   2. Nausea and vomiting in pregnancy Improved and not having any problems at this time.  Preterm labor symptoms and general obstetric precautions including but not limited to vaginal bleeding, contractions, leaking of fluid and fetal movement were reviewed in detail with the patient.  Return in about 4 weeks (around 02/08/2020) for ROB virtual visit.  Future Appointments  Date Time Provider Department Center  02/01/2020  1:00 PM [facility] [place] 3 [facility]     Time spent on virtual visit: 7 minutes  Currie Paris, NP

## 2020-01-11 NOTE — Patient Instructions (Signed)
Morning Sickness ° °Morning sickness is when you feel sick to your stomach (nauseous) during pregnancy. You may feel sick to your stomach and throw up (vomit). You may feel sick in the morning, but you can feel this way at any time of day. Some women feel very sick to their stomach and cannot stop throwing up (hyperemesis gravidarum). °Follow these instructions at home: °Medicines °· Take over-the-counter and prescription medicines only as told by your doctor. Do not take any medicines until you talk with your doctor about them first. °· Taking multivitamins before getting pregnant can stop or lessen the harshness of morning sickness. °Eating and drinking °· Eat dry toast or crackers before getting out of bed. °· Eat 5 or 6 small meals a day. °· Eat dry and bland foods like rice and baked potatoes. °· Do not eat greasy, fatty, or spicy foods. °· Have someone cook for you if the smell of food causes you to feel sick or throw up. °· If you feel sick to your stomach after taking prenatal vitamins, take them at night or with a snack. °· Eat protein when you need a snack. Nuts, yogurt, and cheese are good choices. °· Drink fluids throughout the day. °· Try ginger ale made with real ginger, ginger tea made from fresh grated ginger, or ginger candies. °General instructions °· Do not use any products that have nicotine or tobacco in them, such as cigarettes and e-cigarettes. If you need help quitting, ask your doctor. °· Use an air purifier to keep the air in your house free of smells. °· Get lots of fresh air. °· Try to avoid smells that make you feel sick. °· Try: °? Wearing a bracelet that is used for seasickness (acupressure wristband). °? Going to a doctor who puts thin needles into certain body points (acupuncture) to improve how you feel. °Contact a doctor if: °· You need medicine to feel better. °· You feel dizzy or light-headed. °· You are losing weight. °Get help right away if: °· You feel very sick to your  stomach and cannot stop throwing up. °· You pass out (faint). °· You have very bad pain in your belly. °Summary °· Morning sickness is when you feel sick to your stomach (nauseous) during pregnancy. °· You may feel sick in the morning, but you can feel this way at any time of day. °· Making some changes to what you eat may help your symptoms go away. °This information is not intended to replace advice given to you by your health care provider. Make sure you discuss any questions you have with your health care provider. °Document Revised: 09/10/2017 Document Reviewed: 10/29/2016 °Elsevier Patient Education © 2020 Elsevier Inc. ° °

## 2020-01-11 NOTE — Progress Notes (Signed)
Virtual ROB   Patient able to check B/P while on the phone.    CC: None

## 2020-02-01 ENCOUNTER — Other Ambulatory Visit (HOSPITAL_COMMUNITY): Payer: Self-pay | Admitting: *Deleted

## 2020-02-01 ENCOUNTER — Ambulatory Visit (HOSPITAL_COMMUNITY): Payer: Medicaid Other

## 2020-02-01 ENCOUNTER — Ambulatory Visit (HOSPITAL_COMMUNITY)
Admission: RE | Admit: 2020-02-01 | Discharge: 2020-02-01 | Disposition: A | Discharge status: Home / Self Care | Payer: Medicaid Other | Source: Ambulatory Visit | Attending: Certified Nurse Midwife | Admitting: Certified Nurse Midwife

## 2020-02-01 ENCOUNTER — Other Ambulatory Visit: Payer: Self-pay

## 2020-02-01 DIAGNOSIS — Z363 Encounter for antenatal screening for malformations: Secondary | ICD-10-CM | POA: Diagnosis not present

## 2020-02-01 DIAGNOSIS — Z3480 Encounter for supervision of other normal pregnancy, unspecified trimester: Secondary | ICD-10-CM | POA: Diagnosis present

## 2020-02-01 DIAGNOSIS — Z3A19 19 weeks gestation of pregnancy: Secondary | ICD-10-CM | POA: Diagnosis not present

## 2020-02-01 DIAGNOSIS — Z362 Encounter for other antenatal screening follow-up: Secondary | ICD-10-CM

## 2020-02-02 ENCOUNTER — Encounter: Payer: Self-pay | Admitting: Certified Nurse Midwife

## 2020-02-02 DIAGNOSIS — O4440 Low lying placenta NOS or without hemorrhage, unspecified trimester: Secondary | ICD-10-CM | POA: Insufficient documentation

## 2020-02-02 HISTORY — DX: Low lying placenta nos or without hemorrhage, unspecified trimester: O44.40

## 2020-02-08 ENCOUNTER — Telehealth (INDEPENDENT_AMBULATORY_CARE_PROVIDER_SITE_OTHER): Payer: Medicaid Other | Admitting: Obstetrics

## 2020-02-08 ENCOUNTER — Encounter: Payer: Self-pay | Admitting: Obstetrics

## 2020-02-08 VITALS — BP 135/83 | HR 100

## 2020-02-08 DIAGNOSIS — O4440 Low lying placenta NOS or without hemorrhage, unspecified trimester: Secondary | ICD-10-CM

## 2020-02-08 DIAGNOSIS — O0990 Supervision of high risk pregnancy, unspecified, unspecified trimester: Secondary | ICD-10-CM

## 2020-02-08 NOTE — Progress Notes (Addendum)
OBSTETRICS PRENATAL VIRTUAL VISIT ENCOUNTER NOTE  Provider location: [facility]   I connected with Rachel DraftYasmine Tolen on 02/08/20 at  2:15 PM EDT by MyChart Video Encounter at home and verified that I am speaking with the correct person using two identifiers.   I discussed the limitations, risks, security and privacy concerns of performing an evaluation and management service virtually and the availability of in person appointments. I also discussed with the patient that there may be a patient responsible charge related to this service. The patient expressed understanding and agreed to proceed. Subjective:  Rachel Neal is a 29 y.o. G2P0010 at 5457w0d being seen today for ongoing prenatal care.  She is currently monitored for the following issues for this high-risk pregnancy and has Supervision of other normal pregnancy, antepartum; Asthma affecting pregnancy, antepartum; and Low lying placenta, antepartum on their problem list.  Patient reports heartburn.  Contractions: Not present. Vag. Bleeding: None.  Movement: Present. Denies any leaking of fluid.   The following portions of the patient's history were reviewed and updated as appropriate: allergies, current medications, past family history, past medical history, past social history, past surgical history and problem list.   Objective:   Vitals:   02/08/20 1350 02/08/20 1354  BP: (!) 150/86 135/83  Pulse: (!) 109 100    Fetal Status:     Movement: Present     General:  Alert, oriented and cooperative. Patient is in no acute distress.  Respiratory: Normal respiratory effort, no problems with respiration noted  Mental Status: Normal mood and affect. Normal behavior. Normal judgment and thought content.  Rest of physical exam deferred due to type of encounter  Imaging: US MFM OB COMP + 14 WK  Result Date: 02/01/2020 ----------------------------------------------------------------------  OBSTETRICS REPORT                        (Signed Final 02/01/2020 02:30 pm) ---------------------------------------------------------------------- Patient Info  ID #:       [id number]                          D.O.B.:  1991/02/08 (28 yrs)  Name:       Rachel Neal                  Visit Date: 02/01/2020 01:56 pm ---------------------------------------------------------------------- Performed By  Performed By:     Tommie RaymondMesha Tester BS,       Ref. Address:     [address] [place]                    [address]                                                             [address]                                                             [address] [phone]  [place]                                                             [place]  Attending:        Johnell Comings MD         [facility]  Referred By:      [facility] ---------------------------------------------------------------------- Orders   #  Description                          Code         Ordered By   1  [place] MFM OB COMP + 14 WK               76805.01     VERONICA ROGERS  ----------------------------------------------------------------------   #  Order #                    Accession #                 Episode #   1  [id number]                   [id number]                  [id number]  ---------------------------------------------------------------------- Indications   Encounter for antenatal screening for          Z36.3   malformations   [redacted] weeks gestation of pregnancy                Z3A.19  ---------------------------------------------------------------------- Fetal Evaluation  Num Of Fetuses:         1  Fetal Heart Rate(bpm):  145  Cardiac Activity:       Observed  Presentation:           Cephalic  Placenta:               Low-lying, 1.5cm from int os  P. Cord Insertion:      Visualized, central  Amniotic Fluid  AFI FV:      Within normal limits                               Largest Pocket(cm)                              5.5 ---------------------------------------------------------------------- Biometry  BPD:      43.1  mm     G. Age:  19w 0d                  CI:        79.86   %  FL/HC:      18.0   %  HC:      152.4  mm     G. Age:  18w 2d                  HC/AC:      1.16  AC:      131.3  mm     G. Age:  18w 5d                  FL/BPD:     63.6   %  FL:       27.4  mm     G. Age:  18w 3d                  FL/AC:      20.9   %  HUM:      27.6  mm     G. Age:  18w 6d  CER:      19.9  mm     G. Age:  19w 0d  NFT:       4.5  mm  LV:        7.6  mm  CM:        4.7  mm  Est. FW:     243  gm      0 lb 9 oz ---------------------------------------------------------------------- OB History  Blood Type:    A+  Gravidity:    2         Term:   0        Prem:   0        SAB:   0  TOP:          1       Ectopic:  0        Living: 0 ---------------------------------------------------------------------- Gestational Age  U/S Today:     18w 4d                                        EDD:   06/30/20 ---------------------------------------------------------------------- Anatomy  Cranium:               Appears normal         Aortic Arch:            Appears normal  Cavum:                 Appears normal         Ductal Arch:            Not well visualized  Ventricles:            Appears normal         Diaphragm:              Appears normal  Choroid Plexus:        Appears normal         Stomach:                Appears normal, left  sided  Cerebellum:            Appears normal         Abdomen:                Appears normal  Posterior Fossa:       Appears normal         Abdominal Wall:         Appears nml (cord                                                                        insert, abd wall)  Nuchal Fold:           Appears normal         Cord Vessels:           Appears  normal (3                                                                        vessel cord)  Face:                  Orbits nl; profile not Kidneys:                Appear normal                         well visualized  Lips:                  Not well visualized    Bladder:                Appears normal  Thoracic:              Appears normal         Spine:                  Appears normal  Heart:                 Appears normal         Upper Extremities:      Appears normal                         (4CH, axis, and                         situs)  RVOT:                  Not well visualized    Lower Extremities:      Appears normal  LVOT:                  Not well visualized  Other:  Female gender Nasal bone visualized. Open hands visualized. Heels          and 5th digit visualized.   Technically difficult due to fetal position. ---------------------------------------------------------------------- Cervix Uterus Adnexa  Cervix  Length:            2.9  cm.  Normal appearance by transabdominal scan.  Uterus  No abnormality visualized.  Left Ovary  Within normal limits. No adnexal mass visualized.  Right Ovary  Not visualized. No adnexal mass visualized.  Cul De Sac  No free fluid seen.  Adnexa  No abnormality visualized. ---------------------------------------------------------------------- Comments  This patient was seen for a detailed fetal anatomy scan.  She denies any significant past medical history and denies  any problems in her current pregnancy.  She had a cell free DNA test earlier in her pregnancy which  indicated a low risk for trisomy 8, 75, and 13. A female fetus  is predicted.  She was informed that the fetal growth and amniotic fluid  level were appropriate for her gestational age.  There were no obvious fetal anomalies noted on today's  ultrasound exam.  However, today's exam was limited due to  the fetal position.  The patient was informed that anomalies may be missed due  to technical limitations. If the  fetus is in a suboptimal position  or maternal habitus is increased, visualization of the fetus in  the maternal uterus may be impaired.  A marginal placenta previa was noted today.  The edge of the  posterior placenta measured 1.5 cm away from the internal  cervical os.  The patient was reassured that the placenta will  most likely move away from the lower uterine segment later in  her pregnancy.  A follow-up exam was scheduled in 5 weeks to complete the  views of the fetal anatomy and to assess the placental  location. ----------------------------------------------------------------------                   Ma Rings, MD Electronically Signed Final Report   02/01/2020 02:30 pm ----------------------------------------------------------------------   Assessment and Plan:  Pregnancy: G2P0010 at [redacted]w[redacted]d 1. Low lying placenta, antepartum ( 1.5 cm from internal os of cervix ) - follow up ultrasound scheduled during 3rd trimester - previa precautions  Preterm labor symptoms and general obstetric precautions including but not limited to vaginal bleeding, contractions, leaking of fluid and fetal movement were reviewed in detail with the patient. I discussed the assessment and treatment plan with the patient. The patient was provided an opportunity to ask questions and all were answered. The patient agreed with the plan and demonstrated an understanding of the instructions. The patient was advised to call back or seek an in-person office evaluation/go to MAU at [facility] for any urgent or concerning symptoms. Please refer to After Visit Summary for other counseling recommendations.   I provided 10 minutes of face-to-face time during this encounter.    Future Appointments  Date Time Provider Department Center  03/07/2020  1:45 PM [facility] [place] 4 [facility]-M[facility]US M[facility]-US    Coral Ceo, MD Center for [facility], [facility] 02/08/2020

## 2020-02-08 NOTE — Progress Notes (Signed)
Patient presents for Mychart ROB. Pt identified with 2 patient identifiers. She is [redacted]w[redacted]d. Her bp today is 150/86. Recheck is 135/83. Patient states that she does have occasional headaches. She denies having any swelling or visual changes. Patient has no concerns today.

## 2020-03-07 ENCOUNTER — Ambulatory Visit (HOSPITAL_COMMUNITY): Payer: Medicaid Other | Attending: Obstetrics and Gynecology

## 2020-03-07 ENCOUNTER — Encounter: Payer: Self-pay | Admitting: Certified Nurse Midwife

## 2020-03-07 ENCOUNTER — Telehealth (INDEPENDENT_AMBULATORY_CARE_PROVIDER_SITE_OTHER): Payer: Medicaid Other | Admitting: Certified Nurse Midwife

## 2020-03-07 ENCOUNTER — Other Ambulatory Visit: Payer: Self-pay

## 2020-03-07 VITALS — BP 136/78

## 2020-03-07 DIAGNOSIS — O4442 Low lying placenta NOS or without hemorrhage, second trimester: Secondary | ICD-10-CM | POA: Diagnosis not present

## 2020-03-07 DIAGNOSIS — R030 Elevated blood-pressure reading, without diagnosis of hypertension: Secondary | ICD-10-CM | POA: Insufficient documentation

## 2020-03-07 DIAGNOSIS — Z362 Encounter for other antenatal screening follow-up: Secondary | ICD-10-CM | POA: Diagnosis present

## 2020-03-07 DIAGNOSIS — Z3A23 23 weeks gestation of pregnancy: Secondary | ICD-10-CM | POA: Diagnosis not present

## 2020-03-07 DIAGNOSIS — Z3480 Encounter for supervision of other normal pregnancy, unspecified trimester: Secondary | ICD-10-CM

## 2020-03-07 DIAGNOSIS — O321XX0 Maternal care for breech presentation, not applicable or unspecified: Secondary | ICD-10-CM | POA: Diagnosis not present

## 2020-03-07 DIAGNOSIS — O4440 Low lying placenta NOS or without hemorrhage, unspecified trimester: Secondary | ICD-10-CM

## 2020-03-07 DIAGNOSIS — Z3A24 24 weeks gestation of pregnancy: Secondary | ICD-10-CM

## 2020-03-07 NOTE — Progress Notes (Signed)
   OBSTETRICS PRENATAL VIRTUAL VISIT ENCOUNTER NOTE  Provider location: Center for [facility]   I connected with Rachel Neal on 03/07/20 at 11:00 AM EDT by MyChart Video Encounter at home and verified that I am speaking with the correct person using two identifiers.   I discussed the limitations, risks, security and privacy concerns of performing an evaluation and management service virtually and the availability of in person appointments. I also discussed with the patient that there may be a patient responsible charge related to this service. The patient expressed understanding and agreed to proceed. Subjective:  Rachel Neal is a 29 y.o. G2P0010 at [redacted]w[redacted]d being seen today for ongoing prenatal care.  She is currently monitored for the following issues for this low-risk pregnancy and has Supervision of other normal pregnancy, antepartum; Asthma affecting pregnancy, antepartum; Low lying placenta, antepartum; and Elevated BP without diagnosis of hypertension on their problem list.  Patient reports no complaints.  Contractions: Not present. Vag. Bleeding: None.  Movement: Present. Denies any leaking of fluid.   The following portions of the patient's history were reviewed and updated as appropriate: allergies, current medications, past family history, past medical history, past social history, past surgical history and problem list.   Objective:   Vitals:   03/07/20 1056  BP: 136/78    Fetal Status:     Movement: Present     General:  Alert, oriented and cooperative. Patient is in no acute distress.  Respiratory: Normal respiratory effort, no problems with respiration noted  Mental Status: Normal mood and affect. Normal behavior. Normal judgment and thought content.  Rest of physical exam deferred due to type of encounter  Imaging: No results found.  Assessment and Plan:  Pregnancy: G2P0010 at [redacted]w[redacted]d 1. Supervision of other normal pregnancy, antepartum - patient  doing well, no complaints  - Patient is interested in waterbirth, educated and discussed contraindications to waterbirth, including hypertension  - Routine prenatal care  - Anticipatory guidance on upcoming appointment including GTT at next visit, discussed with patient importance of fasting at MN for appointment, patient verbalizes understanding   2. Low lying placenta, antepartum - follow up US scheduled for today with MFM   3. Elevated BP without diagnosis of hypertension - Babyscripts BP reading reviewed, 1 elevated BP of 141/77 on 5/6, borderline BP noted on babyscripts  - Encouraged patient to bring BP cuff to office to see if there is a difference  - PEC precautions reviewed    Preterm labor symptoms and general obstetric precautions including but not limited to vaginal bleeding, contractions, leaking of fluid and fetal movement were reviewed in detail with the patient. I discussed the assessment and treatment plan with the patient. The patient was provided an opportunity to ask questions and all were answered. The patient agreed with the plan and demonstrated an understanding of the instructions. The patient was advised to call back or seek an in-person office evaluation/go to MAU at [facility] for any urgent or concerning symptoms. Please refer to After Visit Summary for other counseling recommendations.   I provided 12 minutes of face-to-face time during this encounter.  Return in about 4 weeks (around 04/04/2020) for ROB/GTT.  Future Appointments  Date Time Provider Department Center  03/07/2020  1:45 PM [facility] US4 [facility]    Sharyon Cable, CNM Center for [employer], [facility]

## 2020-03-07 NOTE — Progress Notes (Signed)
Virtual OB  CC: None   

## 2020-03-07 NOTE — Patient Instructions (Signed)
Glucose Tolerance Test During Pregnancy Why am I having this test? The glucose tolerance test (GTT) is done to check how your body processes sugar (glucose). This is one of several tests used to diagnose diabetes that develops during pregnancy (gestational diabetes mellitus). Gestational diabetes is a temporary form of diabetes that some women develop during pregnancy. It usually occurs during the second trimester of pregnancy and goes away after delivery. Testing (screening) for gestational diabetes usually occurs between 24 and 28 weeks of pregnancy. You may have the GTT test after having a 1-hour glucose screening test if the results from that test indicate that you may have gestational diabetes. You may also have this test if:  You have a history of gestational diabetes.  You have a history of giving birth to very large babies or have experienced repeated fetal loss (stillbirth).  You have signs and symptoms of diabetes, such as: ? Changes in your vision. ? Tingling or numbness in your hands or feet. ? Changes in hunger, thirst, and urination that are not otherwise explained by your pregnancy. What is being tested? This test measures the amount of glucose in your blood at different times during a period of 3 hours. This indicates how well your body is able to process glucose. What kind of sample is taken?  Blood samples are required for this test. They are usually collected by inserting a needle into a blood vessel. How do I prepare for this test?  For 3 days before your test, eat normally. Have plenty of carbohydrate-rich foods.  Follow instructions from your health care provider about: ? Eating or drinking restrictions on the day of the test. You may be asked to not eat or drink anything other than water (fast) starting 8-10 hours before the test. ? Changing or stopping your regular medicines. Some medicines may interfere with this test. Tell a health care provider about:  All  medicines you are taking, including vitamins, herbs, eye drops, creams, and over-the-counter medicines.  Any blood disorders you have.  Any surgeries you have had.  Any medical conditions you have. What happens during the test? First, your blood glucose will be measured. This is referred to as your fasting blood glucose, since you fasted before the test. Then, you will drink a glucose solution that contains a certain amount of glucose. Your blood glucose will be measured again 1, 2, and 3 hours after drinking the solution. This test takes about 3 hours to complete. You will need to stay at the testing location during this time. During the testing period:  Do not eat or drink anything other than the glucose solution.  Do not exercise.  Do not use any products that contain nicotine or tobacco, such as cigarettes and e-cigarettes. If you need help stopping, ask your health care provider. The testing procedure may vary among health care providers and hospitals. How are the results reported? Your results will be reported as milligrams of glucose per deciliter of blood (mg/dL) or millimoles per liter (mmol/L). Your health care provider will compare your results to normal ranges that were established after testing a large group of people (reference ranges). Reference ranges may vary among labs and hospitals. For this test, common reference ranges are:  Fasting: less than 95-105 mg/dL (5.3-5.8 mmol/L).  1 hour after drinking glucose: less than 180-190 mg/dL (10.0-10.5 mmol/L).  2 hours after drinking glucose: less than 155-165 mg/dL (8.6-9.2 mmol/L).  3 hours after drinking glucose: 140-145 mg/dL (7.8-8.1 mmol/L). What do the   results mean? Results within reference ranges are considered normal, meaning that your glucose levels are well-controlled. If two or more of your blood glucose levels are high, you may be diagnosed with gestational diabetes. If only one level is high, your health care  provider may suggest repeat testing or other tests to confirm a diagnosis. Talk with your health care provider about what your results mean. Questions to ask your health care provider Ask your health care provider, or the department that is doing the test:  When will my results be ready?  How will I get my results?  What are my treatment options?  What other tests do I need?  What are my next steps? Summary  The glucose tolerance test (GTT) is one of several tests used to diagnose diabetes that develops during pregnancy (gestational diabetes mellitus). Gestational diabetes is a temporary form of diabetes that some women develop during pregnancy.  You may have the GTT test after having a 1-hour glucose screening test if the results from that test indicate that you may have gestational diabetes. You may also have this test if you have any symptoms or risk factors for gestational diabetes.  Talk with your health care provider about what your results mean. This information is not intended to replace advice given to you by your health care provider. Make sure you discuss any questions you have with your health care provider. Document Revised: 01/19/2019 Document Reviewed: 05/10/2017 Elsevier Patient Education  2020 Elsevier Inc.  

## 2020-04-03 ENCOUNTER — Other Ambulatory Visit: Payer: Medicaid Other

## 2020-04-03 ENCOUNTER — Ambulatory Visit (INDEPENDENT_AMBULATORY_CARE_PROVIDER_SITE_OTHER): Payer: Medicaid Other | Admitting: Women's Health

## 2020-04-03 ENCOUNTER — Other Ambulatory Visit: Payer: Self-pay

## 2020-04-03 VITALS — BP 126/84 | HR 94 | Wt 223.4 lb

## 2020-04-03 DIAGNOSIS — O4442 Low lying placenta NOS or without hemorrhage, second trimester: Secondary | ICD-10-CM

## 2020-04-03 DIAGNOSIS — Z3480 Encounter for supervision of other normal pregnancy, unspecified trimester: Secondary | ICD-10-CM

## 2020-04-03 DIAGNOSIS — Z23 Encounter for immunization: Secondary | ICD-10-CM | POA: Diagnosis not present

## 2020-04-03 DIAGNOSIS — J45909 Unspecified asthma, uncomplicated: Secondary | ICD-10-CM

## 2020-04-03 DIAGNOSIS — R030 Elevated blood-pressure reading, without diagnosis of hypertension: Secondary | ICD-10-CM

## 2020-04-03 DIAGNOSIS — O4440 Low lying placenta NOS or without hemorrhage, unspecified trimester: Secondary | ICD-10-CM

## 2020-04-03 DIAGNOSIS — Z3482 Encounter for supervision of other normal pregnancy, second trimester: Secondary | ICD-10-CM

## 2020-04-03 DIAGNOSIS — O99512 Diseases of the respiratory system complicating pregnancy, second trimester: Secondary | ICD-10-CM

## 2020-04-03 DIAGNOSIS — Z3A27 27 weeks gestation of pregnancy: Secondary | ICD-10-CM

## 2020-04-03 LAB — CYTOLOGY - PAP: Diagnosis: NEGATIVE

## 2020-04-03 NOTE — Progress Notes (Signed)
Subjective:  Rachel Neal is a 29 y.o. G2P0010 at [redacted]w[redacted]d being seen today for ongoing prenatal care.  She is currently monitored for the following issues for this low-risk pregnancy and has Supervision of other normal pregnancy, antepartum; Asthma affecting pregnancy, antepartum; Low lying placenta, antepartum; and Elevated BP without diagnosis of hypertension on their problem list.  Patient reports no complaints.  Contractions: Not present. Vag. Bleeding: None.  Movement: Present. Denies leaking of fluid.   The following portions of the patient's history were reviewed and updated as appropriate: allergies, current medications, past family history, past medical history, past social history, past surgical history and problem list. Problem list updated.  Objective:   Vitals:   04/03/20 0933  BP: 126/84  Pulse: 94  Weight: 223 lb 6.4 oz (101.3 kg)    Fetal Status: Fetal Heart Rate (bpm): 145 Fundal Height: 28 cm Movement: Present     General:  Alert, oriented and cooperative. Patient is in no acute distress.  Skin: Skin is warm and dry. No rash noted.   Cardiovascular: Normal heart rate noted  Respiratory: Normal respiratory effort, no problems with respiration noted  Abdomen: Soft, gravid, appropriate for gestational age. Pain/Pressure: Absent     Pelvic: Vag. Bleeding: None     Cervical exam deferred        Extremities: Normal range of motion.  Edema: None  Mental Status: Normal mood and affect. Normal behavior. Normal judgment and thought content.   Urinalysis:      Assessment and Plan:  Pregnancy: G2P0010 at [redacted]w[redacted]d  1. Supervision of other normal pregnancy, antepartum - pap records obtained today - NILM - info on contraception given - peds list given - Tdap today, info given - Glucose Tolerance, 2 Hours w/1 Hour - CBC - HIV antibody (with reflex) - RPR  2. Elevated BP without diagnosis of hypertension -BP today 126/84 -baseline labs drawn today -pt reports elevated BP  with poorly functioning cuff only, has never had elevated reading at office --Reviewed warning blood pressure values (systolic = / > 140 and/or diastolic =/> 90). Explained that, if blood pressure is elevated, she should sit down, rest, and eat/drink something. If still elevated 15 minutes later, and she is greater than 20 weeks, she should call clinic or come to MAU. She should come to MAU if she has elevated pressures and any of the following:  - headache not relieved with tylenol, rest, hydration -blurry vision, floating spots in her vision - sudden full-body edema or facial edema -RUQ pain that is constant.   These symptoms may indicate that her blood pressure is worsening and she may be developing gestational hypertension or pre-eclampsia, which is an emergency.   3. Asthma affecting pregnancy, antepartum -no concerns  4. Low lying placenta, antepartum -resolved  Preterm labor symptoms and general obstetric precautions including but not limited to vaginal bleeding, contractions, leaking of fluid and fetal movement were reviewed in detail with the patient. Please refer to After Visit Summary for other counseling recommendations.  Return in about 2 weeks (around 04/17/2020) for virtual LOB/APP OK, needs nurse visit in 1-2 days for BP cuff check.   Aveion Nguyen, Odie Sera, NP

## 2020-04-03 NOTE — Patient Instructions (Addendum)
Maternity Assessment Unit (MAU)  The Maternity Assessment Unit (MAU) is located at the [facility] and [facility]. The address is: [address] [place], [place],  [address]. Please see map below for additional directions.    The Maternity Assessment Unit is designed to help you during your pregnancy, and for up to 6 weeks after delivery, with any pregnancy- or postpartum-related emergencies, if you think you are in labor, or if your water has broken. For example, if you experience nausea and vomiting, vaginal bleeding, severe abdominal or pelvic pain, elevated blood pressure or other problems related to your pregnancy or postpartum time, please come to the Maternity Assessment Unit for assistance.       https://www.cdc.gov/vaccines/hcp/vis/vis-statements/tdap.pdf">  Tdap (Tetanus, Diphtheria, Pertussis) Vaccine: What You Need to Know 1. Why get vaccinated? Tdap vaccine can prevent tetanus, diphtheria, and pertussis. Diphtheria and pertussis spread from person to person. Tetanus enters the body through cuts or wounds.  TETANUS (T) causes painful stiffening of the muscles. Tetanus can lead to serious health problems, including being unable to open the mouth, having trouble swallowing and breathing, or death.  DIPHTHERIA (D) can lead to difficulty breathing, heart failure, paralysis, or death.  PERTUSSIS (aP), also known as "whooping cough," can cause uncontrollable, violent coughing which makes it hard to breathe, eat, or drink. Pertussis can be extremely serious in babies and young children, causing pneumonia, convulsions, brain damage, or death. In teens and adults, it can cause weight loss, loss of bladder control, passing out, and rib fractures from severe coughing. 2. Tdap vaccine Tdap is only for children 7 years and older, adolescents, and adults.  Adolescents should receive a single dose of Tdap, preferably at age 88 or 33 years. Pregnant  women should get a dose of Tdap during every pregnancy, to protect the newborn from pertussis. Infants are most at risk for severe, life-threatening complications from pertussis. Adults who have never received Tdap should get a dose of Tdap. Also, adults should receive a booster dose every 10 years, or earlier in the case of a severe and dirty wound or burn. Booster doses can be either Tdap or Td (a different vaccine that protects against tetanus and diphtheria but not pertussis). Tdap may be given at the same time as other vaccines. 3. Talk with your health care provider Tell your vaccine provider if the person getting the vaccine:  Has had an allergic reaction after a previous dose of any vaccine that protects against tetanus, diphtheria, or pertussis, or has any severe, life-threatening allergies.  Has had a coma, decreased level of consciousness, or prolonged seizures within 7 days after a previous dose of any pertussis vaccine (DTP, DTaP, or Tdap).  Has seizures or another nervous system problem.  Has ever had Guillain-Barr Syndrome (also called GBS).  Has had severe pain or swelling after a previous dose of any vaccine that protects against tetanus or diphtheria. In some cases, your health care provider may decide to postpone Tdap vaccination to a future visit.  People with minor illnesses, such as a cold, may be vaccinated. People who are moderately or severely ill should usually wait until they recover before getting Tdap vaccine.  Your health care provider can give you more information. 4. Risks of a vaccine reaction  Pain, redness, or swelling where the shot was given, mild fever, headache, feeling tired, and nausea, vomiting, diarrhea, or stomachache sometimes happen after Tdap vaccine. People sometimes faint after medical procedures, including vaccination. Tell your  provider if you feel dizzy or have vision changes or ringing in the ears.  As with any medicine, there is a very  remote chance of a vaccine causing a severe allergic reaction, other serious injury, or death. 5. What if there is a serious problem? An allergic reaction could occur after the vaccinated person leaves the clinic. If you see signs of a severe allergic reaction (hives, swelling of the face and throat, difficulty breathing, a fast heartbeat, dizziness, or weakness), call 9-1-1 and get the person to the nearest hospital. For other signs that concern you, call your health care provider.  Adverse reactions should be reported to the Vaccine Adverse Event Reporting System (VAERS). Your health care provider will usually file this report, or you can do it yourself. Visit the VAERS website at www.vaers.LAgents.no or call 303-734-3123. VAERS is only for reporting reactions, and VAERS staff do not give medical advice. 6. The National Vaccine Injury Compensation Program The [employer] Vaccine Injury Compensation Program (VICP) is a federal program that was created to compensate people who may have been injured by certain vaccines. Visit the VICP website at SpiritualWord.at or call 608-066-7067 to learn about the program and about filing a claim. There is a time limit to file a claim for compensation. 7. How can I learn more?  Ask your health care provider.  Call your local or state health department.  Contact the Centers for Disease Control and Prevention (CDC): ? Call 417-444-0194 (1-800-CDC-INFO) or ? Visit CDC's website at PicCapture.uy Vaccine Information Statement Tdap (Tetanus, Diphtheria, Pertussis) Vaccine (01/11/2019) This information is not intended to replace advice given to you by your health care provider. Make sure you discuss any questions you have with your health care provider. Document Revised: 01/20/2019 Document Reviewed: 01/23/2019 Elsevier Patient Education  2020 [employer].        Preterm Labor and Birth Information  The normal length of a pregnancy  is 39-41 weeks. Preterm labor is when labor starts before 37 completed weeks of pregnancy. What are the risk factors for preterm labor? Preterm labor is more likely to occur in women who:  Have certain infections during pregnancy such as a bladder infection, sexually transmitted infection, or infection inside the uterus (chorioamnionitis).  Have a shorter-than-normal cervix.  Have gone into preterm labor before.  Have had surgery on their cervix.  Are younger than age 36 or older than age 13.  Are African American.  Are pregnant with twins or multiple babies (multiple gestation).  Take street drugs or smoke while pregnant.  Do not gain enough weight while pregnant.  Became pregnant shortly after having been pregnant. What are the symptoms of preterm labor? Symptoms of preterm labor include:  Cramps similar to those that can happen during a menstrual period. The cramps may happen with diarrhea.  Pain in the abdomen or lower back.  Regular uterine contractions that may feel like tightening of the abdomen.  A feeling of increased pressure in the pelvis.  Increased watery or bloody mucus discharge from the vagina.  Water breaking (ruptured amniotic sac). Why is it important to recognize signs of preterm labor? It is important to recognize signs of preterm labor because babies who are born prematurely may not be fully developed. This can put them at an increased risk for:  Long-term (chronic) heart and lung problems.  Difficulty immediately after birth with regulating body systems, including blood sugar, body temperature, heart rate, and breathing rate.  Bleeding in the brain.  Cerebral palsy.  Learning difficulties.  Death. These risks are highest for babies who are born before 50 weeks of pregnancy. How is preterm labor treated? Treatment depends on the length of your pregnancy, your condition, and the health of your baby. It may involve:  Having a stitch (suture)  placed in your cervix to prevent your cervix from opening too early (cerclage).  Taking or being given medicines, such as: ? Hormone medicines. These may be given early in pregnancy to help support the pregnancy. ? Medicine to stop contractions. ? Medicines to help mature the baby's lungs. These may be prescribed if the risk of delivery is high. ? Medicines to prevent your baby from developing cerebral palsy. If the labor happens before 34 weeks of pregnancy, you may need to stay in the hospital. What should I do if I think I am in preterm labor? If you think that you are going into preterm labor, call your health care provider right away. How can I prevent preterm labor in future pregnancies? To increase your chance of having a full-term pregnancy:  Do not use any tobacco products, such as cigarettes, chewing tobacco, and e-cigarettes. If you need help quitting, ask your health care provider.  Do not use street drugs or medicines that have not been prescribed to you during your pregnancy.  Talk with your health care provider before taking any herbal supplements, even if you have been taking them regularly.  Make sure you gain a healthy amount of weight during your pregnancy.  Watch for infection. If you think that you might have an infection, get it checked right away.  Make sure to tell your health care provider if you have gone into preterm labor before. This information is not intended to replace advice given to you by your health care provider. Make sure you discuss any questions you have with your health care provider. Document Revised: 01/20/2019 Document Reviewed: 02/19/2016 Elsevier Patient Education  [place].        Glucose Tolerance Test During Pregnancy Why am I having this test? The glucose tolerance test (GTT) is done to check how your body processes sugar (glucose). This is one of several tests used to diagnose diabetes that develops during pregnancy  (gestational diabetes mellitus). Gestational diabetes is a temporary form of diabetes that some women develop during pregnancy. It usually occurs during the second trimester of pregnancy and goes away after delivery. Testing (screening) for gestational diabetes usually occurs between 24 and 28 weeks of pregnancy. You may have the GTT test after having a 1-hour glucose screening test if the results from that test indicate that you may have gestational diabetes. You may also have this test if:  You have a history of gestational diabetes.  You have a history of giving birth to very large babies or have experienced repeated fetal loss (stillbirth).  You have signs and symptoms of diabetes, such as: ? Changes in your vision. ? Tingling or numbness in your hands or feet. ? Changes in hunger, thirst, and urination that are not otherwise explained by your pregnancy. What is being tested? This test measures the amount of glucose in your blood at different times during a period of 3 hours. This indicates how well your body is able to process glucose. What kind of sample is taken?  Blood samples are required for this test. They are usually collected by inserting a needle into a blood vessel. How do I prepare for this test?  For 3 days before your test,  eat normally. Have plenty of carbohydrate-rich foods.  Follow instructions from your health care provider about: ? Eating or drinking restrictions on the day of the test. You may be asked to not eat or drink anything other than water (fast) starting 8-10 hours before the test. ? Changing or stopping your regular medicines. Some medicines may interfere with this test. Tell a health care provider about:  All medicines you are taking, including vitamins, herbs, eye drops, creams, and over-the-counter medicines.  Any blood disorders you have.  Any surgeries you have had.  Any medical conditions you have. What happens during the test? First, your  blood glucose will be measured. This is referred to as your fasting blood glucose, since you fasted before the test. Then, you will drink a glucose solution that contains a certain amount of glucose. Your blood glucose will be measured again 1, 2, and 3 hours after drinking the solution. This test takes about 3 hours to complete. You will need to stay at the testing location during this time. During the testing period:  Do not eat or drink anything other than the glucose solution.  Do not exercise.  Do not use any products that contain nicotine or tobacco, such as cigarettes and e-cigarettes. If you need help stopping, ask your health care provider. The testing procedure may vary among health care providers and hospitals. How are the results reported? Your results will be reported as milligrams of glucose per deciliter of blood (mg/dL) or millimoles per liter (mmol/L). Your health care provider will compare your results to normal ranges that were established after testing a large group of people (reference ranges). Reference ranges may vary among labs and hospitals. For this test, common reference ranges are:  Fasting: less than 95-105 mg/dL (1.8-8.4 mmol/L).  1 hour after drinking glucose: less than 180-190 mg/dL (16.6-06.3 mmol/L).  2 hours after drinking glucose: less than 155-165 mg/dL (0.1-6.0 mmol/L).  3 hours after drinking glucose: 140-145 mg/dL (1.0-9.3 mmol/L). What do the results mean? Results within reference ranges are considered normal, meaning that your glucose levels are well-controlled. If two or more of your blood glucose levels are high, you may be diagnosed with gestational diabetes. If only one level is high, your health care provider may suggest repeat testing or other tests to confirm a diagnosis. Talk with your health care provider about what your results mean. Questions to ask your health care provider Ask your health care provider, or the department that is doing the  test:  When will my results be ready?  How will I get my results?  What are my treatment options?  What other tests do I need?  What are my next steps? Summary  The glucose tolerance test (GTT) is one of several tests used to diagnose diabetes that develops during pregnancy (gestational diabetes mellitus). Gestational diabetes is a temporary form of diabetes that some women develop during pregnancy.  You may have the GTT test after having a 1-hour glucose screening test if the results from that test indicate that you may have gestational diabetes. You may also have this test if you have any symptoms or risk factors for gestational diabetes.  Talk with your health care provider about what your results mean. This information is not intended to replace advice given to you by your health care provider. Make sure you discuss any questions you have with your health care provider. Document Revised: 01/19/2019 Document Reviewed: 05/10/2017 Elsevier Patient Education  2020 [employer].  Contraception Choices Contraception, also called birth control, refers to methods or devices that prevent pregnancy. Hormonal methods Contraceptive implant  A contraceptive implant is a thin, plastic tube that contains a hormone. It is inserted into the upper part of the arm. It can remain in place for up to 3 years. Progestin-only injections Progestin-only injections are injections of progestin, a synthetic form of the hormone progesterone. They are given every 3 months by a health care provider. Birth control pills  Birth control pills are pills that contain hormones that prevent pregnancy. They must be taken once a day, preferably at the same time each day. Birth control patch  The birth control patch contains hormones that prevent pregnancy. It is placed on the skin and must be changed once a week for three weeks and removed on the fourth week. A prescription is needed to use this method of  contraception. Vaginal ring  A vaginal ring contains hormones that prevent pregnancy. It is placed in the vagina for three weeks and removed on the fourth week. After that, the process is repeated with a new ring. A prescription is needed to use this method of contraception. Emergency contraceptive Emergency contraceptives prevent pregnancy after unprotected sex. They come in pill form and can be taken up to 5 days after sex. They work best the sooner they are taken after having sex. Most emergency contraceptives are available without a prescription. This method should not be used as your only form of birth control. Barrier methods Female condom  A female condom is a thin sheath that is worn over the penis during sex. Condoms keep sperm from going inside a woman's body. They can be used with a spermicide to increase their effectiveness. They should be disposed after a single use. Female condom  A female condom is a soft, loose-fitting sheath that is put into the vagina before sex. The condom keeps sperm from going inside a woman's body. They should be disposed after a single use. Diaphragm  A diaphragm is a soft, dome-shaped barrier. It is inserted into the vagina before sex, along with a spermicide. The diaphragm blocks sperm from entering the uterus, and the spermicide kills sperm. A diaphragm should be left in the vagina for 6-8 hours after sex and removed within 24 hours. A diaphragm is prescribed and fitted by a health care provider. A diaphragm should be replaced every 1-2 years, after giving birth, after gaining more than 15 lb (6.8 kg), and after pelvic surgery. Cervical cap  A cervical cap is a round, soft latex or plastic cup that fits over the cervix. It is inserted into the vagina before sex, along with spermicide. It blocks sperm from entering the uterus. The cap should be left in place for 6-8 hours after sex and removed within 48 hours. A cervical cap must be prescribed and fitted by a  health care provider. It should be replaced every 2 years. Sponge  A sponge is a soft, circular piece of polyurethane foam with spermicide on it. The sponge helps block sperm from entering the uterus, and the spermicide kills sperm. To use it, you make it wet and then insert it into the vagina. It should be inserted before sex, left in for at least 6 hours after sex, and removed and thrown away within 30 hours. Spermicides Spermicides are chemicals that kill or block sperm from entering the cervix and uterus. They can come as a cream, jelly, suppository, foam, or tablet. A spermicide should be inserted into  the vagina with an applicator at least 10-15 minutes before sex to allow time for it to work. The process must be repeated every time you have sex. Spermicides do not require a prescription. Intrauterine contraception Intrauterine device (IUD) An IUD is a T-shaped device that is put in a woman's uterus. There are two types:  Hormone IUD.This type contains progestin, a synthetic form of the hormone progesterone. This type can stay in place for 3-5 years.  Copper IUD.This type is wrapped in copper wire. It can stay in place for 10 years.  Permanent methods of contraception Female tubal ligation In this method, a woman's fallopian tubes are sealed, tied, or blocked during surgery to prevent eggs from traveling to the uterus. Hysteroscopic sterilization In this method, a small, flexible insert is placed into each fallopian tube. The inserts cause scar tissue to form in the fallopian tubes and block them, so sperm cannot reach an egg. The procedure takes about 3 months to be effective. Another form of birth control must be used during those 3 months. Female sterilization This is a procedure to tie off the tubes that carry sperm (vasectomy). After the procedure, the man can still ejaculate fluid (semen). Natural planning methods Natural family planning In this method, a couple does not have sex on  days when the woman could become pregnant. Calendar method This means keeping track of the length of each menstrual cycle, identifying the days when pregnancy can happen, and not having sex on those days. Ovulation method In this method, a couple avoids sex during ovulation. Symptothermal method This method involves not having sex during ovulation. The woman typically checks for ovulation by watching changes in her temperature and in the consistency of cervical mucus. Post-ovulation method In this method, a couple waits to have sex until after ovulation. Summary  Contraception, also called birth control, means methods or devices that prevent pregnancy.  Hormonal methods of contraception include implants, injections, pills, patches, vaginal rings, and emergency contraceptives.  Barrier methods of contraception can include female condoms, female condoms, diaphragms, cervical caps, sponges, and spermicides.  There are two types of IUDs (intrauterine devices). An IUD can be put in a woman's uterus to prevent pregnancy for 3-5 years.  Permanent sterilization can be done through a procedure for males, females, or both.  Natural family planning methods involve not having sex on days when the woman could become pregnant. This information is not intended to replace advice given to you by your health care provider. Make sure you discuss any questions you have with your health care provider. Document Revised: 09/30/2017 Document Reviewed: 10/31/2016 Elsevier Patient Education  2020 Elsevier Inc.       AREA PEDIATRIC/FAMILY PRACTICE PHYSICIANS  [facility] [address] [place], [place] [address] Phone - [phone]   Fax - [phone]  [facility] [address] [place], [place]  [address] Phone - [phone]   Fax - [phone]  [facility] [address] [place], [place]  [address] Phone - [phone]   Fax - [phone]  [facility] [address] [place], [place]  [address] Phone - [phone]   Fax - [phone]  [facility] [address] [place], [place]  [address] Phone - [phone]   Fax - [phone]  [facility] [address] [place], [place]  [address] Phone - [phone]   Fax - [phone]  [facility] [address] [place], [place]  [address] Phone - [phone]   Fax - [phone]  [facility] [address] [place], [address] [place], [place]  [address] Phone - [phone]   Fax - [phone]  [facility] AT [facility] [address] [place], [place]  [address] Phone - [phone]   Fax - [phone] [facility] [place] [address] [place], [place]  [address] Phone - [phone]   Fax - [phone]  [facility] [address] [place], [place]  [address] Phone - [phone]   Fax - [phone]  [facility] [address], [address] [place], [place]  [address] Phone - [phone]   Fax - [phone]  [facility] [address] [place], [place]  [address] Phone - [phone]   Fax - [phone]  [facility] [address], [address] [place], [place]  [address] Phone - [phone]  [facility] [address] [place], [place]  [address] Phone - [phone]   Fax - [phone]  [facility] [address] [place], [place]  [address] Phone - [phone]   Fax - [phone]  [facility] [address] [place], [place]  [address] Phone - [phone]   Fax - [phone]  [facility] [address] [place], [place]  [address] Phone - [phone]   Fax - [phone]   [facility] [address] [place], [place]   [address] Phone - [phone]   Fax - [phone]  [facility] [address] [place], [place]  [address] Phone - [phone]   Fax - [phone]  DAVID RUBIN [address] [place], [place]  [address] Phone - [phone]   Fax - [phone]  [facility] [address] [place], [place]  [address] Phone - [phone]   Fax - [phone]  [facility] [address] [place], [place]  [address] Phone - [phone]   Fax - [phone] Gerarda Fraction [address], [place]  [address] Phone - [phone]   Fax - [phone]  [facility] [address] [place], [place]  [address] Phone - [phone]   Fax - [phone]  [facility] [place] [address], [address] [place], [place]  [address] Phone - [phone]   Fax - [phone]         Preeclampsia and Eclampsia Preeclampsia is a serious condition that may develop during pregnancy. This condition causes high blood pressure and increased protein in your urine along with other symptoms, such as headaches and vision changes. These symptoms may develop as the condition gets worse. Preeclampsia may occur at 20 weeks of pregnancy or later. Diagnosing and treating preeclampsia early is very important. If not treated early, it can cause serious problems for you and your baby. One problem it can lead to is eclampsia. Eclampsia is a condition that causes muscle jerking or shaking (convulsions or seizures) and other serious problems for the mother. During pregnancy, delivering your baby may be the best treatment for preeclampsia or eclampsia. For most women, preeclampsia and eclampsia symptoms go away after giving birth. In rare cases, a woman may develop preeclampsia after giving birth (postpartum preeclampsia). This usually occurs within 48 hours after childbirth but may occur up to 6 weeks after  giving birth. What are the causes?  The cause of preeclampsia is not known. What increases the risk? The following risk factors make you more likely to develop preeclampsia:  Being pregnant for the first time.  Having had preeclampsia during a past pregnancy.  Having a family history of preeclampsia.  Having high blood pressure.  Being pregnant with more than one baby.  Being 6 or older.  Being African-American.  Having kidney disease or diabetes.  Having medical conditions such as lupus or blood diseases.  Being very overweight (obese). What are the signs or symptoms? The most common symptoms are:  Severe headaches.  Vision problems, such as blurred or double vision.  Abdominal pain, especially upper abdominal pain. Other symptoms that may develop as the condition gets worse include:  Sudden weight gain.  Sudden swelling of the hands, face, legs, and feet.  Severe nausea and vomiting.  Numbness in the face, arms, legs, and feet.  Dizziness.  Urinating less than usual.  Slurred speech.  Convulsions or seizures. How is this diagnosed? There are no screening tests for preeclampsia. Your health care provider will ask you about symptoms and check for signs of preeclampsia during your prenatal visits. You may also have tests that include:  Checking your blood pressure.  Urine tests to check for protein. Your health care provider will check for this at every prenatal visit.  Blood tests.  Monitoring your baby's heart rate.  Ultrasound. How is this treated? You and your health care provider will determine the treatment approach that is best for you. Treatment may include:  Having more frequent prenatal exams to check for signs of preeclampsia, if you have an increased risk for preeclampsia.  Medicine to lower your blood pressure.  Staying in the hospital, if your condition is severe. There, treatment will focus on controlling your blood pressure and the  amount of fluids in your body (fluid retention).  Taking medicine (magnesium sulfate) to prevent seizures. This may be given as an injection or through an IV.  Taking a low-dose aspirin during your pregnancy.  Delivering your baby early. You may have your labor started with medicine (induced), or you may have a cesarean delivery. Follow these instructions at home: Eating and drinking   Drink enough fluid to keep your urine pale yellow.  Avoid caffeine. Lifestyle  Do not use any products that contain nicotine or tobacco, such as cigarettes and e-cigarettes. If you need help quitting, ask your health care provider.  Do not use alcohol or drugs.  Avoid stress as much as possible. Rest and get plenty of sleep. General instructions  Take over-the-counter and prescription medicines only as told by your health care provider.  When lying down, lie on your left side. This keeps pressure off your major blood vessels.  When sitting or lying down, raise (elevate) your feet. Try putting some pillows underneath your lower legs.  Exercise regularly. Ask your health care provider what kinds of exercise are best for you.  Keep all follow-up and prenatal visits as told by your health care provider. This is important. How is this prevented? There is no known way of preventing preeclampsia or eclampsia from developing. However, to lower your risk of complications and detect problems early:  Get regular prenatal care. Your health care provider may be able to diagnose and treat the condition early.  Maintain a healthy weight. Ask your health care provider for help managing weight gain during pregnancy.  Work with your health care provider to manage any long-term (chronic) health conditions you  have, such as diabetes or kidney problems.  You may have tests of your blood pressure and kidney function after giving birth.  Your health care provider may have you take low-dose aspirin during your next  pregnancy. Contact a health care provider if:  You have symptoms that your health care provider told you may require more treatment or monitoring, such as: ? Headaches. ? Nausea or vomiting. ? Abdominal pain. ? Dizziness. ? Light-headedness. Get help right away if:  You have severe: ? Abdominal pain. ? Headaches that do not get better. ? Dizziness. ? Vision problems. ? Confusion. ? Nausea or vomiting.  You have any of the following: ? A seizure. ? Sudden, rapid weight gain. ? Sudden swelling in your hands, ankles, or face. ? Trouble moving any part of your body. ? Numbness in any part of your body. ? Trouble speaking. ? Abnormal bleeding.  You faint. Summary  Preeclampsia is a serious condition that may develop during pregnancy.  This condition causes high blood pressure and increased protein in your urine along with other symptoms, such as headaches and vision changes.  Diagnosing and treating preeclampsia early is very important. If not treated early, it can cause serious problems for you and your baby.  Get help right away if you have symptoms that your health care provider told you to watch for. This information is not intended to replace advice given to you by your health care provider. Make sure you discuss any questions you have with your health care provider. Document Revised: 05/31/2018 Document Reviewed: 05/04/2016 Elsevier Patient Education  2020 [employer]

## 2020-04-03 NOTE — Progress Notes (Signed)
ROB/GTT.  TDAP given in LD, tolerated well.  Reports no complaints today.

## 2020-04-04 ENCOUNTER — Other Ambulatory Visit: Payer: Self-pay | Admitting: Women's Health

## 2020-04-04 ENCOUNTER — Encounter: Payer: Self-pay | Admitting: Women's Health

## 2020-04-04 ENCOUNTER — Ambulatory Visit: Payer: Medicaid Other | Admitting: *Deleted

## 2020-04-04 VITALS — BP 128/79

## 2020-04-04 DIAGNOSIS — O24415 Gestational diabetes mellitus in pregnancy, controlled by oral hypoglycemic drugs: Secondary | ICD-10-CM | POA: Insufficient documentation

## 2020-04-04 DIAGNOSIS — O24419 Gestational diabetes mellitus in pregnancy, unspecified control: Secondary | ICD-10-CM

## 2020-04-04 DIAGNOSIS — Z3480 Encounter for supervision of other normal pregnancy, unspecified trimester: Secondary | ICD-10-CM

## 2020-04-04 LAB — PROTEIN / CREATININE RATIO, URINE
Creatinine, Urine: 127.8 mg/dL
Protein, Ur: 10.3 mg/dL
Protein/Creat Ratio: 81 mg/g creat (ref 0–200)

## 2020-04-04 LAB — COMPREHENSIVE METABOLIC PANEL
ALT: 24 IU/L (ref 0–32)
AST: 11 IU/L (ref 0–40)
Albumin/Globulin Ratio: 1.2 (ref 1.2–2.2)
Albumin: 3.6 g/dL — ABNORMAL LOW (ref 3.9–5.0)
Alkaline Phosphatase: 89 IU/L (ref 48–121)
BUN/Creatinine Ratio: 8 — ABNORMAL LOW (ref 9–23)
BUN: 7 mg/dL (ref 6–20)
Bilirubin Total: 0.2 mg/dL (ref 0.0–1.2)
CO2: 21 mmol/L (ref 20–29)
Calcium: 8.9 mg/dL (ref 8.7–10.2)
Chloride: 101 mmol/L (ref 96–106)
Creatinine, Ser: 0.86 mg/dL (ref 0.57–1.00)
GFR calc Af Amer: 108 mL/min/{1.73_m2} (ref >59)
GFR calc non Af Amer: 94 mL/min/{1.73_m2} (ref >59)
Globulin, Total: 3 g/dL (ref 1.5–4.5)
Glucose: 222 mg/dL — ABNORMAL HIGH (ref 65–99)
Potassium: 4.2 mmol/L (ref 3.5–5.2)
Sodium: 134 mmol/L (ref 134–144)
Total Protein: 6.6 g/dL (ref 6.0–8.5)

## 2020-04-04 LAB — GLUCOSE TOLERANCE, 2 HOURS W/ 1HR
Glucose, 1 hour: 220 mg/dL — ABNORMAL HIGH (ref 65–179)
Glucose, 2 hour: 226 mg/dL — ABNORMAL HIGH (ref 65–152)
Glucose, Fasting: 121 mg/dL — ABNORMAL HIGH (ref 65–91)

## 2020-04-04 LAB — CBC
Hematocrit: 36.8 % (ref 34.0–46.6)
Hemoglobin: 12.4 g/dL (ref 11.1–15.9)
MCH: 30.1 pg (ref 26.6–33.0)
MCHC: 33.7 g/dL (ref 31.5–35.7)
MCV: 89 fL (ref 79–97)
Platelets: 241 10*3/uL (ref 150–450)
RBC: 4.12 x10E6/uL (ref 3.77–5.28)
RDW: 12.4 % (ref 11.7–15.4)
WBC: 7.9 10*3/uL (ref 3.4–10.8)

## 2020-04-04 LAB — HIV ANTIBODY (ROUTINE TESTING W REFLEX): HIV Screen 4th Generation wRfx: NONREACTIVE

## 2020-04-04 LAB — RPR: RPR Ser Ql: NONREACTIVE

## 2020-04-04 NOTE — Progress Notes (Signed)
Order for diabetes education.  Marylen Ponto, NP  8:19 AM 04/04/2020

## 2020-04-04 NOTE — Progress Notes (Signed)
Patient has gestational diabetes. Order entered for diabetes education. Please help to schedule appt ASAP. Pt notified of results by MyChart.

## 2020-04-04 NOTE — Progress Notes (Signed)
Pt is in office today for BP check. Pt brought her cuff from home as comparison.  BP with home cuff:  131/80 BP with cuff in office: 128/79  Pt states she may have occ HA, denies any changes in vision or swelling.  Pt did learn that she now has GDM today as well. Pt made aware she will be contacted to schedule an appt with N&D.  Pt BP stable in office today.

## 2020-04-05 NOTE — Progress Notes (Signed)
Patient was assessed and managed by nursing staff during this encounter. I have reviewed the chart and agree with the documentation and plan.   Cherre Robins, CNM 04/05/2020 11:51 PM

## 2020-04-10 ENCOUNTER — Encounter: Payer: Medicaid Other | Attending: Women's Health | Admitting: Registered"

## 2020-04-10 ENCOUNTER — Other Ambulatory Visit: Payer: Self-pay

## 2020-04-10 DIAGNOSIS — O24419 Gestational diabetes mellitus in pregnancy, unspecified control: Secondary | ICD-10-CM | POA: Insufficient documentation

## 2020-04-11 ENCOUNTER — Other Ambulatory Visit: Payer: Medicaid Other

## 2020-04-11 ENCOUNTER — Encounter: Payer: Self-pay | Admitting: Registered"

## 2020-04-11 ENCOUNTER — Telehealth: Payer: Self-pay | Admitting: Registered"

## 2020-04-11 ENCOUNTER — Other Ambulatory Visit: Payer: Self-pay

## 2020-04-11 DIAGNOSIS — O24419 Gestational diabetes mellitus in pregnancy, unspecified control: Secondary | ICD-10-CM

## 2020-04-11 MED ORDER — ACCU-CHEK GUIDE VI STRP
ORAL_STRIP | 12 refills | Status: DC
Start: 2020-04-11 — End: 2020-06-09

## 2020-04-11 MED ORDER — ACCU-CHEK SOFTCLIX LANCETS MISC: 12 refills | Status: DC

## 2020-04-11 NOTE — Progress Notes (Signed)
Patient was seen on 04/10/20 for Gestational Diabetes self-management class at [facility]. The following learning objectives were met by the patient during this course:   States the definition of Gestational Diabetes  States why dietary management is important in controlling blood glucose  Describes the effects each nutrient has on blood glucose levels  Demonstrates ability to create a balanced meal plan  Demonstrates carbohydrate counting   States when to check blood glucose levels  Demonstrates proper blood glucose monitoring techniques  States the effect of stress and exercise on blood glucose levels  States the importance of limiting caffeine and abstaining from alcohol and smoking  Blood glucose monitor given: Acc[employer] Me Lot #[id number] Exp: 05/31/2021 Blood Glucose: 213 mg/dL  Patient instructed to monitor glucose levels: FBS: 60 - <95; 1 hour: <140; 2 hour: <120  Patient received handouts:  Nutrition Diabetes and Pregnancy, including carb counting list  Patient was asked to return for follow-up visit due to high CBG.

## 2020-04-11 NOTE — Telephone Encounter (Signed)
Patient attended GDM class at [facility] yesterday. RD wanted to follow-up with patient due to high CBG in class and she was scheduled for a f/u visit at [facility]. RD left message on patients VM asking her to reschedule since she missed her appointment today.

## 2020-04-17 ENCOUNTER — Ambulatory Visit (INDEPENDENT_AMBULATORY_CARE_PROVIDER_SITE_OTHER): Payer: Medicaid Other | Admitting: Obstetrics and Gynecology

## 2020-04-17 VITALS — BP 129/88 | HR 85

## 2020-04-17 DIAGNOSIS — O99891 Other specified diseases and conditions complicating pregnancy: Secondary | ICD-10-CM

## 2020-04-17 DIAGNOSIS — Z3A29 29 weeks gestation of pregnancy: Secondary | ICD-10-CM

## 2020-04-17 DIAGNOSIS — Z3480 Encounter for supervision of other normal pregnancy, unspecified trimester: Secondary | ICD-10-CM

## 2020-04-17 DIAGNOSIS — O24419 Gestational diabetes mellitus in pregnancy, unspecified control: Secondary | ICD-10-CM

## 2020-04-17 DIAGNOSIS — N949 Unspecified condition associated with female genital organs and menstrual cycle: Secondary | ICD-10-CM

## 2020-04-17 NOTE — Progress Notes (Signed)
Virtual ROB   CC: NONE   pt  States she has checking blood sugars and they have been good.   B/P: 129/88 P: 85

## 2020-04-17 NOTE — Progress Notes (Signed)
   MY CHART VIDEO VIRTUAL OBSTETRICS VISIT ENCOUNTER NOTE  I connected with Rachel Neal on 04/17/20 at  2:00 PM EDT by My Chart video at home and verified that I am speaking with the correct person using two identifiers. Provider located at [facility] for [employer] at [place].   I discussed the limitations, risks, security and privacy concerns of performing an evaluation and management service by My Chart video and the availability of in person appointments. I also discussed with the patient that there may be a patient responsible charge related to this service. The patient expressed understanding and agreed to proceed.  Subjective:  Rachel Neal is a 29 y.o. G2P0010 at [redacted]w[redacted]d being followed for ongoing prenatal care.  She is currently monitored for the following issues for this low-risk pregnancy and has Supervision of other normal pregnancy, antepartum; Asthma affecting pregnancy, antepartum; Low lying placenta, antepartum; Elevated BP without diagnosis of hypertension; and Gestational diabetes on their problem list.  Patient reports lower abdominal cramping that hurts with walking, but resolves with rest. "Not sure if it is RLP". Reports fetal movement. Denies any contractions, bleeding or leaking of fluid.   The following portions of the patient's history were reviewed and updated as appropriate: allergies, current medications, past family history, past medical history, past social history, past surgical history and problem list.   Objective:   General:  Alert, oriented and cooperative.   Mental Status: Normal mood and affect perceived. Normal judgment and thought content.  Rest of physical exam deferred due to type of encounter  BP 129/88   Pulse 85   LMP 09/21/2019  **Done by patient's own at home BP cuff and scale  Assessment and Plan:  Pregnancy: G2P0010 at [redacted]w[redacted]d  1. Supervision of other normal pregnancy, antepartum - My Chart visit in 2 wks  2. Round ligament pain -  Information provided on RLP - Advised to take Tylenol 1000 mg prn, soak ina warm tub of water and lie on the affected side   3. Gestational diabetes mellitus (GDM) in third trimester, gestational diabetes method of control unspecified - Reviewed BS log with patient: FBS range 85-110, 2 hr PP range 106-123 (1 value was 179, but taken 1 hour after eating) - Advised to drink plenty of water and be sure to follow diet taught and keep consisitent checks of blood sugars   Preterm labor symptoms and general obstetric precautions including but not limited to vaginal bleeding, contractions, leaking of fluid and fetal movement were reviewed in detail with the patient.  I discussed the assessment and treatment plan with the patient. The patient was provided an opportunity to ask questions and all were answered. The patient agreed with the plan and demonstrated an understanding of the instructions. The patient was advised to call back or seek an in-person office evaluation/go to MAU at [facility] for any urgent or concerning symptoms. Please refer to After Visit Summary for other counseling recommendations.   I provided 5 minutes of non-face-to-face time during this encounter. There was 5 minutes of chart review time spent prior to this encounter. Total time spent = 10 minutes.  Return in about 2 weeks (around 05/01/2020) for Return OB - My Chart video.  Future Appointments  Date Time Provider Department Center  04/18/2020  2:15 PM [facility]    Raelyn Mora, CNM Center for [employer], [facility]

## 2020-04-17 NOTE — Patient Instructions (Signed)

## 2020-04-18 ENCOUNTER — Ambulatory Visit: Payer: Medicaid Other | Admitting: Registered"

## 2020-04-18 ENCOUNTER — Other Ambulatory Visit: Payer: Self-pay

## 2020-04-18 ENCOUNTER — Encounter: Payer: Medicaid Other | Attending: Women's Health | Admitting: Registered"

## 2020-04-18 DIAGNOSIS — O24419 Gestational diabetes mellitus in pregnancy, unspecified control: Secondary | ICD-10-CM

## 2020-04-18 NOTE — Progress Notes (Signed)
Patient was seen on 04/18/20 for follow-up assessment and education for Gestational Diabetes. Patient attended the GDM class at [facility] and is here for follow-up due to random CBG >200 mg/dL   EDD 02[birth year]27[birth year]. Patient states changes to diet/lifestyle including only drinking water and is watching carb intake   Patient is testing blood glucose but not consistently as directed. Pt has been doing a few extra checks to see her pre-meal, 1 hr and 2 hr post-prandial readings. This has helped decide how to change some of her meals.   Review of Log Book shows:  appears her limited readings are mostly WNL.  Pt tested meal of broccoli, chicken, cheddar pasta bake and states she ate a larger portion than she needed: Pre-meal: 103 mg/dL, 1 hr 166 mg/dL, 2 hr 063 mg/dL  Transportation: pt states she does not have a car and relies on boyfriend and sister to go to her appointments.  The following learning objectives reviewed during follow-up visit:   Using Babyscripts for tracking CBGs  Importance of sleep for restoration  Plan:  . When scheduling appointments you can ask for transportation assistance . Consider sleep tips to get into a more scheduled sleep pattern . Continue eating plenty of vegetables and monitoring carb intake . Continue drinking plenty of water  Patient instructed to monitor glucose levels: FBS: 60 - 95 mg/dl 2 hour: <016 mg/dl  Patient received the following handouts:  Sleep Hygiene  Patient will be seen for follow-up in as needed.

## 2020-04-23 ENCOUNTER — Other Ambulatory Visit: Payer: Self-pay

## 2020-05-01 ENCOUNTER — Telehealth (INDEPENDENT_AMBULATORY_CARE_PROVIDER_SITE_OTHER): Payer: Medicaid Other | Admitting: Obstetrics and Gynecology

## 2020-05-01 ENCOUNTER — Encounter: Payer: Self-pay | Admitting: Obstetrics and Gynecology

## 2020-05-01 VITALS — BP 139/79 | HR 92

## 2020-05-01 DIAGNOSIS — Z3480 Encounter for supervision of other normal pregnancy, unspecified trimester: Secondary | ICD-10-CM

## 2020-05-01 DIAGNOSIS — O24410 Gestational diabetes mellitus in pregnancy, diet controlled: Secondary | ICD-10-CM

## 2020-05-01 DIAGNOSIS — Z3A31 31 weeks gestation of pregnancy: Secondary | ICD-10-CM

## 2020-05-01 MED ORDER — METFORMIN HCL 500 MG PO TABS: 500.0000 mg | ORAL_TABLET | Freq: Every day | ORAL | 5 refills | Status: DC

## 2020-05-01 NOTE — Progress Notes (Signed)
   OBSTETRICS PRENATAL VIRTUAL VISIT ENCOUNTER NOTE  Provider location: Center for [facility] [place]   I connected with Rachel Neal on 05/01/20 at  8:45 AM EDT by MyChart Video Encounter at home and verified that I am speaking with the correct person using two identifiers.   I discussed the limitations, risks, security and privacy concerns of performing an evaluation and management service virtually and the availability of in person appointments. I also discussed with the patient that there may be a patient responsible charge related to this service. The patient expressed understanding and agreed to proceed. Subjective:  Rachel Neal is a 29 y.o. G2P0010 at [redacted]w[redacted]d being seen today for ongoing prenatal care.  She is currently monitored for the following issues for this high-risk pregnancy and has Supervision of other normal pregnancy, antepartum; Asthma affecting pregnancy, antepartum; Low lying placenta, antepartum; Elevated BP without diagnosis of hypertension; Gestational diabetes mellitus (GDM) in third trimester; and Round ligament pain on their problem list.  Patient reports no complaints.  Contractions: Not present. Vag. Bleeding: None.  Movement: Present. Denies any leaking of fluid.   The following portions of the patient's history were reviewed and updated as appropriate: allergies, current medications, past family history, past medical history, past social history, past surgical history and problem list.   Objective:   Vitals:   05/01/20 0826  BP: 139/79  Pulse: 92    Fetal Status:     Movement: Present     General:  Alert, oriented and cooperative. Patient is in no acute distress.  Respiratory: Normal respiratory effort, no problems with respiration noted  Mental Status: Normal mood and affect. Normal behavior. Normal judgment and thought content.  Rest of physical exam deferred due to type of encounter  Imaging: No results found.  Assessment and Plan:    Pregnancy: G2P0010 at [redacted]w[redacted]d 1. Supervision of other normal pregnancy, antepartum Patient is doing well without complaints  2. Diet controlled gestational diabetes mellitus (GDM) in third trimester CBGs reviewed with the patient. Highest fasting 125 with average values of 106 and highest pp 159 with majority of values less than 120 Discussed starting metformin 500 mg at bedtime to help with fasting readings. Patient declined starting metformin at this time as she plans to continue making necessary dietary changes. Discussed that fasting readings are not related to food intake.  Rx sent to her pharmacy and patient states she will consider taking it if readings remain elevated Will start weekly antenatal testing next week Discussed importance of euglycemia in pregnancy and maternal/fetal consequences  Preterm labor symptoms and general obstetric precautions including but not limited to vaginal bleeding, contractions, leaking of fluid and fetal movement were reviewed in detail with the patient. I discussed the assessment and treatment plan with the patient. The patient was provided an opportunity to ask questions and all were answered. The patient agreed with the plan and demonstrated an understanding of the instructions. The patient was advised to call back or seek an in-person office evaluation/go to MAU at [facility] for any urgent or concerning symptoms. Please refer to After Visit Summary for other counseling recommendations.   I provided 20 minutes of face-to-face time during this encounter.  Return in about 2 weeks (around 05/15/2020) for in person, ROB, High risk.  No future appointments.  Catalina Antigua, MD Center for [employer], [facility]

## 2020-05-06 ENCOUNTER — Ambulatory Visit: Payer: Medicaid Other | Attending: Obstetrics and Gynecology

## 2020-05-06 ENCOUNTER — Other Ambulatory Visit: Payer: Self-pay

## 2020-05-06 DIAGNOSIS — Z3A32 32 weeks gestation of pregnancy: Secondary | ICD-10-CM

## 2020-05-06 DIAGNOSIS — Z362 Encounter for other antenatal screening follow-up: Secondary | ICD-10-CM

## 2020-05-06 DIAGNOSIS — O24415 Gestational diabetes mellitus in pregnancy, controlled by oral hypoglycemic drugs: Secondary | ICD-10-CM

## 2020-05-06 DIAGNOSIS — O24410 Gestational diabetes mellitus in pregnancy, diet controlled: Secondary | ICD-10-CM | POA: Diagnosis present

## 2020-05-07 ENCOUNTER — Other Ambulatory Visit: Payer: Self-pay | Admitting: *Deleted

## 2020-05-07 DIAGNOSIS — O24415 Gestational diabetes mellitus in pregnancy, controlled by oral hypoglycemic drugs: Secondary | ICD-10-CM

## 2020-05-13 ENCOUNTER — Other Ambulatory Visit: Payer: Self-pay

## 2020-05-13 ENCOUNTER — Ambulatory Visit: Payer: Medicaid Other | Attending: Obstetrics and Gynecology

## 2020-05-13 ENCOUNTER — Ambulatory Visit: Payer: Medicaid Other | Admitting: *Deleted

## 2020-05-13 ENCOUNTER — Encounter: Payer: Self-pay | Admitting: *Deleted

## 2020-05-13 DIAGNOSIS — O4440 Low lying placenta NOS or without hemorrhage, unspecified trimester: Secondary | ICD-10-CM

## 2020-05-13 DIAGNOSIS — Z362 Encounter for other antenatal screening follow-up: Secondary | ICD-10-CM

## 2020-05-13 DIAGNOSIS — Z7984 Long term (current) use of oral hypoglycemic drugs: Secondary | ICD-10-CM

## 2020-05-13 DIAGNOSIS — Z3A33 33 weeks gestation of pregnancy: Secondary | ICD-10-CM | POA: Diagnosis not present

## 2020-05-13 DIAGNOSIS — O24415 Gestational diabetes mellitus in pregnancy, controlled by oral hypoglycemic drugs: Secondary | ICD-10-CM | POA: Insufficient documentation

## 2020-05-15 ENCOUNTER — Ambulatory Visit (INDEPENDENT_AMBULATORY_CARE_PROVIDER_SITE_OTHER): Payer: Medicaid Other | Admitting: Obstetrics and Gynecology

## 2020-05-15 ENCOUNTER — Other Ambulatory Visit: Payer: Self-pay

## 2020-05-15 VITALS — BP 123/83 | HR 88 | Wt 224.0 lb

## 2020-05-15 DIAGNOSIS — Z3A33 33 weeks gestation of pregnancy: Secondary | ICD-10-CM

## 2020-05-15 DIAGNOSIS — Z3480 Encounter for supervision of other normal pregnancy, unspecified trimester: Secondary | ICD-10-CM

## 2020-05-15 NOTE — Patient Instructions (Signed)

## 2020-05-15 NOTE — Progress Notes (Addendum)
   PRENATAL VISIT NOTE  Subjective:  Rachel Neal is a 29 y.o. G2P0010 at [redacted]w[redacted]d being seen today for ongoing prenatal care.  She is currently monitored for the following issues for this high-risk pregnancy and has Supervision of other normal pregnancy, antepartum; Asthma affecting pregnancy, antepartum; Low lying placenta, antepartum; Elevated BP without diagnosis of hypertension; Gestational diabetes mellitus (GDM) in third trimester; and Round ligament pain on their problem list.  Patient doing well with no acute concerns today. She reports fatigue.  Contractions: Not present. Vag. Bleeding: None.  Movement: Present. Denies leaking of fluid.   Pt notes she needs to improve compliance with her diabetic diet. FBS 89-113, majority in low 100's PPBS: 94-151 She is only taking metformin once in the evenings, pt advised to begin taking the medicine twice daily  The following portions of the patient's history were reviewed and updated as appropriate: allergies, current medications, past family history, past medical history, past social history, past surgical history and problem list. Problem list updated.  Objective:   Vitals:   05/15/20 1534  BP: 123/83  Pulse: 88  Weight: 224 lb (101.6 kg)    Fetal Status: Fetal Heart Rate (bpm): 140   Movement: Present     General:  Alert, oriented and cooperative. Patient is in no acute distress.  Skin: Skin is warm and dry. No rash noted.   Cardiovascular: Normal heart rate noted  Respiratory: Normal respiratory effort, no problems with respiration noted  Abdomen: Soft, gravid, appropriate for gestational age.  Pain/Pressure: Absent     Pelvic: Cervical exam deferred        Extremities: Normal range of motion.     Mental Status:  Normal mood and affect. Normal behavior. Normal judgment and thought content.   Assessment and Plan:  Pregnancy: G2P0010 at [redacted]w[redacted]d  1. Supervision of other normal pregnancy, antepartum 36 week labs next visit  2.  Oral medication controlled gestational diabetes: Pt to begin metformin 500 mg po BID, improve diet control and compliance, if ineffective, pt may need  insulin for the latter portion of her pregnancy  3. [redacted] weeks gestation  Preterm labor symptoms and general obstetric precautions including but not limited to vaginal bleeding, contractions, leaking of fluid and fetal movement were reviewed in detail with the patient.  Please refer to After Visit Summary for other counseling recommendations.   Return in about 2 weeks (around 05/29/2020) for [facility], in person, 36 weeks swabs.   Mariel Aloe, MD

## 2020-05-20 ENCOUNTER — Encounter: Payer: Self-pay | Admitting: *Deleted

## 2020-05-20 ENCOUNTER — Ambulatory Visit: Payer: Medicaid Other | Admitting: *Deleted

## 2020-05-20 ENCOUNTER — Ambulatory Visit: Payer: Medicaid Other | Attending: Obstetrics and Gynecology

## 2020-05-20 ENCOUNTER — Other Ambulatory Visit: Payer: Self-pay

## 2020-05-20 DIAGNOSIS — O4440 Low lying placenta NOS or without hemorrhage, unspecified trimester: Secondary | ICD-10-CM | POA: Diagnosis present

## 2020-05-20 DIAGNOSIS — O24415 Gestational diabetes mellitus in pregnancy, controlled by oral hypoglycemic drugs: Secondary | ICD-10-CM | POA: Diagnosis not present

## 2020-05-20 DIAGNOSIS — Z3A34 34 weeks gestation of pregnancy: Secondary | ICD-10-CM

## 2020-05-27 ENCOUNTER — Encounter: Payer: Self-pay | Admitting: *Deleted

## 2020-05-27 ENCOUNTER — Other Ambulatory Visit: Payer: Self-pay

## 2020-05-27 ENCOUNTER — Ambulatory Visit: Payer: Medicaid Other | Admitting: *Deleted

## 2020-05-27 ENCOUNTER — Ambulatory Visit: Payer: Medicaid Other | Attending: Obstetrics and Gynecology

## 2020-05-27 DIAGNOSIS — O24415 Gestational diabetes mellitus in pregnancy, controlled by oral hypoglycemic drugs: Secondary | ICD-10-CM | POA: Diagnosis not present

## 2020-05-27 DIAGNOSIS — Z3A35 35 weeks gestation of pregnancy: Secondary | ICD-10-CM | POA: Diagnosis not present

## 2020-05-27 DIAGNOSIS — O4440 Low lying placenta NOS or without hemorrhage, unspecified trimester: Secondary | ICD-10-CM

## 2020-05-27 DIAGNOSIS — Z7984 Long term (current) use of oral hypoglycemic drugs: Secondary | ICD-10-CM

## 2020-05-28 ENCOUNTER — Other Ambulatory Visit: Payer: Self-pay | Admitting: *Deleted

## 2020-05-28 DIAGNOSIS — O24415 Gestational diabetes mellitus in pregnancy, controlled by oral hypoglycemic drugs: Secondary | ICD-10-CM

## 2020-05-29 ENCOUNTER — Other Ambulatory Visit: Payer: Self-pay

## 2020-05-29 ENCOUNTER — Ambulatory Visit (INDEPENDENT_AMBULATORY_CARE_PROVIDER_SITE_OTHER): Payer: Medicaid Other | Admitting: Obstetrics and Gynecology

## 2020-05-29 ENCOUNTER — Other Ambulatory Visit (HOSPITAL_COMMUNITY)
Admission: RE | Admit: 2020-05-29 | Discharge: 2020-05-29 | Disposition: A | Discharge status: Home / Self Care | Payer: Medicaid Other | Source: Ambulatory Visit | Attending: Obstetrics and Gynecology | Admitting: Obstetrics and Gynecology

## 2020-05-29 ENCOUNTER — Encounter: Payer: Self-pay | Admitting: Obstetrics and Gynecology

## 2020-05-29 VITALS — BP 137/83 | HR 94 | Wt 225.0 lb

## 2020-05-29 DIAGNOSIS — O24415 Gestational diabetes mellitus in pregnancy, controlled by oral hypoglycemic drugs: Secondary | ICD-10-CM

## 2020-05-29 DIAGNOSIS — Z3480 Encounter for supervision of other normal pregnancy, unspecified trimester: Secondary | ICD-10-CM | POA: Diagnosis not present

## 2020-05-29 DIAGNOSIS — Z3A35 35 weeks gestation of pregnancy: Secondary | ICD-10-CM

## 2020-05-29 DIAGNOSIS — J45909 Unspecified asthma, uncomplicated: Secondary | ICD-10-CM

## 2020-05-29 DIAGNOSIS — O99519 Diseases of the respiratory system complicating pregnancy, unspecified trimester: Secondary | ICD-10-CM

## 2020-05-29 NOTE — Progress Notes (Signed)
   PRENATAL VISIT NOTE  Subjective:  Rachel Neal is a 29 y.o. G2P0010 at [redacted]w[redacted]d being seen today for ongoing prenatal care.  She is currently monitored for the following issues for this high-risk pregnancy and has Supervision of other normal pregnancy, antepartum; Asthma affecting pregnancy, antepartum; Elevated BP without diagnosis of hypertension; Gestational diabetes mellitus (GDM) in third trimester controlled on oral hypoglycemic drug; Round ligament pain; [redacted] weeks gestation of pregnancy; and [redacted] weeks gestation of pregnancy on their problem list.  Patient doing well with no acute concerns today. She reports no complaints.  Contractions: Not present. Vag. Bleeding: None.  Movement: Present. Denies leaking of fluid.   The following portions of the patient's history were reviewed and updated as appropriate: allergies, current medications, past family history, past medical history, past social history, past surgical history and problem list. Problem list updated.  Objective:   Vitals:   05/29/20 1410  BP: 137/83  Pulse: 94  Weight: 225 lb (102.1 kg)    Fetal Status: Fetal Heart Rate (bpm): 153 Fundal Height: 35 cm Movement: Present     General:  Alert, oriented and cooperative. Patient is in no acute distress.  Skin: Skin is warm and dry. No rash noted.   Cardiovascular: Normal heart rate noted  Respiratory: Normal respiratory effort, no problems with respiration noted  Abdomen: Soft, gravid, appropriate for gestational age.  Pain/Pressure: Absent     Pelvic: Cervical exam performed Dilation: Closed Effacement (%): 40 Station: -2  Extremities: Normal range of motion.  Edema: None  Mental Status:  Normal mood and affect. Normal behavior. Normal judgment and thought content.  FBS: 86-102  One outlier 123 PPBS: 86-135, 4 outliers noted with max 135   Assessment and Plan:  Pregnancy: G2P0010 at [redacted]w[redacted]d  1. Gestational diabetes mellitus (GDM) in third trimester controlled on oral  hypoglycemic drug Weekly BPP, growth is scheduled, pt advised to tighten up on her diet due to occasional elevated values  2. Asthma affecting pregnancy, antepartum No issues  3. Supervision of other normal pregnancy, antepartum Routine care - Cervicovaginal ancillary only( [place]) - Strep Gp B NAA  4. [redacted] weeks gestation of pregnancy   Preterm labor symptoms and general obstetric precautions including but not limited to vaginal bleeding, contractions, leaking of fluid and fetal movement were reviewed in detail with the patient.  Please refer to After Visit Summary for other counseling recommendations.   No follow-ups on file.   Mariel Aloe, MD

## 2020-05-29 NOTE — Progress Notes (Signed)
ROB  CC: None   Pt states she has been checking sugars and she brought log of her sugars today. Request Cervix check.

## 2020-05-29 NOTE — Patient Instructions (Signed)
Gestational Diabetes Mellitus, Self Care When you have gestational diabetes (gestational diabetes mellitus), you must make sure your blood sugar (glucose) stays in a healthy range. You can do this with:  Nutrition.  Exercise.  Lifestyle changes.  Medicines or insulin, if needed.  Support from your doctors and others. If you get treated for this condition, it may not hurt you or your unborn baby (fetus). If you do not get treated for this condition, it may cause problems that can hurt you or your unborn baby. If you get gestational diabetes, you are:  More likely to get it if you get pregnant again.  More likely to develop type 2 diabetes in the future. How to stay aware of blood sugar   Check your blood sugar every day while you are pregnant. Check it as often as told.  Call your doctor if your blood sugar is above your goal numbers for two tests in a row. Your doctor will set personal treatment goals for you. Generally, you should have these blood sugar levels:  Before meals, or after not eating for a long time (fasting or preprandial): at or below 95 mg/dL (5.3 mmol/L).  After meals (postprandial): ? One hour after a meal: at or below 140 mg/dL (7.8 mmol/L). ? Two hours after a meal: at or below 120 mg/dL (6.7 mmol/L).  A1c (hemoglobin A1c) level: 6-6.5%. How to manage high and low blood sugar Signs of high blood sugar High blood sugar is called hyperglycemia. Know the early signs of high blood sugar. Signs may include:  Feeling: ? Thirsty. ? Hungry. ? Very tired.  Needing to pee (urinate) more than usual.  Blurry vision. Signs of low blood sugar Low blood sugar is called hypoglycemia. This is when blood sugar is at or below 70 mg/dL (3.9 mmol/L). Signs may include:  Feeling: ? Hungry. ? Worried or nervous (anxious). ? Sweaty and clammy. ? Confused. ? Dizzy. ? Sleepy. ? Sick to your stomach (nauseous).  Having: ? A fast heartbeat. ? A headache. ? A change  in your vision. ? Tingling or no feeling (numbness) around your mouth, lips, or tongue. ? Jerky movements that you cannot control (seizure).  Having trouble with: ? Moving (coordination). ? Sleeping. ? Passing out (fainting). ? Getting upset easily (irritability). Treating low blood sugar To treat low blood sugar, eat or drink something sugary right away. If you can think clearly and swallow safely, follow the 15:15 rule:  Take 15 grams of a fast-acting carb (carbohydrate). Talk with your doctor about how much you should take.  Some fast-acting carbs are: ? Sugar tablets (glucose pills). Take 3-4 glucose pills. ? 6-8 pieces of hard candy. ? 4-6 oz (120-150 mL) of fruit juice. ? 4-6 oz (120-150 mL) of regular (not diet) soda. ? 1 Tbsp (15 mL) honey or sugar.  Check your blood sugar 15 minutes after you take the carb.  If your blood sugar is still at or below 70 mg/dL (3.9 mmol/L), take 15 grams of a carb again.  If your blood sugar does not go above 70 mg/dL (3.9 mmol/L) after 3 tries, get help right away.  After your blood sugar goes back to normal, eat a meal or a snack within 1 hour. Treating very low blood sugar If your blood sugar is at or below 54 mg/dL (3 mmol/L), you have very low blood sugar (severe hypoglycemia). This is an emergency. Do not wait to see if the symptoms will go away. Get medical help right  away. Call your local emergency services (911 in the [place] If you have very low blood sugar and you cannot eat or drink, you may need a glucagon shot (injection). A family member or friend should learn how to check your blood sugar and how to give you a glucagon shot. Ask your doctor if you need to have a glucagon shot kit at home. Follow these instructions at home: Medicine  Take your insulin and diabetes medicines as told.  If your doctor says you should take more or less insulin or medicines, do this exactly as told.  Do not run out of insulin or  medicines. Food   Make healthy food choices. These include: ? Chicken, fish, egg whites, and beans. ? Oats, whole wheat, bulgur, brown rice, quinoa, and millet. ? Fresh fruits and vegetables. ? Low-fat dairy products. ? Nuts, avocado, olive oil, and canola oil.  Meet with a food specialist (dietitian). He or she can help you make an eating plan that is right for you.  Follow instructions from your doctor about what you cannot eat or drink.  Drink enough fluid to keep your pee (urine) pale yellow.  Eat healthy snacks between healthy meals.  Keep track of carbs that you eat. Do this by reading food labels and learning food serving sizes.  Follow your sick day plan when you cannot eat or drink normally. Make this plan with your doctor so it is ready to use. Activity  Exercise for 30 or more minutes a day, or as much as your doctor recommends.  Talk with your doctor before you start a new exercise or activity. Your doctor may need to tell you to change: ? How much insulin or medicines you take. ? How much food you eat. Lifestyle  Do not drink alcohol.  Do not use any tobacco products. These include cigarettes, chewing tobacco, and e-cigarettes. If you need help quitting, ask your doctor.  Learn how to deal with stress. If you need help with this, ask your doctor. Body care  Stay up to date with your shots (immunizations).  Brush your teeth and gums two times a day. Floss one or more times a day.  Go to the dentist one or more times every 6 months.  Stay at a healthy weight while you are pregnant. General instructions  Take over-the-counter and prescription medicines only as told by your doctor.  Ask your doctor about risks of high blood pressure in pregnancy (preeclampsia and eclampsia).  Share your diabetes care plan with: ? Your work or school. ? People you live with.  Check your pee for ketones: ? When you are sick. ? As told by your doctor.  Carry a card or  wear jewelry that says you have diabetes.  Keep all follow-up visits as told by your doctor. This is important. Care after giving birth  Have your blood sugar checked 4-12 weeks after you give birth.  Get checked for diabetes one or more times during 3 years. Questions to ask your doctor  Do I need to meet with a diabetes educator?  Where can I find a support group for people with gestational diabetes? Where to find more information To learn more about diabetes, visit:  American Diabetes Association: www.diabetes.org  Centers for Disease Control and Prevention (CDC): www.cdc.gov Summary  Check your blood sugar (glucose) every day while you are pregnant. Check it as often as told.  Take your insulin and diabetes medicines as told.  Keep all follow-up visits as   told by your doctor. This is important.  Have your blood sugar checked 4-12 weeks after you give birth. This information is not intended to replace advice given to you by your health care provider. Make sure you discuss any questions you have with your health care provider. Document Revised: 03/21/2018 Document Reviewed: 11/01/2015 Else[employer] Patient Education  [place] [facility] Pregnancy  The third trimester is from week 28 through week 40 (months 7 through 9). This trimester is when your unborn baby (fetus) is growing very fast. At the end of the ninth month, the unborn baby is about 20 inches in length. It weighs about 6-10 pounds. Follow these instructions at home: Medicines  Take over-the-counter and prescription medicines only as told by your doctor. Some medicines are safe and some medicines are not safe during pregnancy.  Take a prenatal vitamin that contains at least 600 micrograms (mcg) of folic acid.  If you have trouble pooping (constipation), take medicine that will make your stool soft (stool softener) if your doctor approves. Eating and drinking   Eat regular, healthy meals.  Avoid  raw meat and uncooked cheese.  If you get low calcium from the food you eat, talk to your doctor about taking a daily calcium supplement.  Eat four or five small meals rather than three large meals a day.  Avoid foods that are high in fat and sugars, such as fried and sweet foods.  To prevent constipation: ? Eat foods that are high in fiber, like fresh fruits and vegetables, whole grains, and beans. ? Drink enough fluids to keep your pee (urine) clear or pale yellow. Activity  Exercise only as told by your doctor. Stop exercising if you start to have cramps.  Avoid heavy lifting, wear low heels, and sit up straight.  Do not exercise if it is too hot, too humid, or if you are in a place of great height (high altitude).  You may continue to have sex unless your doctor tells you not to. Relieving pain and discomfort  Wear a good support bra if your breasts are tender.  Take frequent breaks and rest with your legs raised if you have leg cramps or low back pain.  Take warm water baths (sitz baths) to soothe pain or discomfort caused by hemorrhoids. Use hemorrhoid cream if your doctor approves.  If you develop puffy, bulging veins (varicose veins) in your legs: ? Wear support hose or compression stockings as told by your doctor. ? Raise (elevate) your feet for 15 minutes, 3-4 times a day. ? Limit salt in your food. Safety  Wear your seat belt when driving.  Make a list of emergency phone numbers, including numbers for family, friends, the hospital, and police and fire departments. Preparing for your baby's arrival To prepare for the arrival of your baby:  Take prenatal classes.  Practice driving to the hospital.  Visit the hospital and tour the maternity area.  Talk to your work about taking leave once the baby comes.  Pack your hospital bag.  Prepare the baby's room.  Go to your doctor visits.  Buy a rear-facing car seat. Learn how to install it in your car. General  instructions  Do not use hot tubs, steam rooms, or saunas.  Do not use any products that contain nicotine or tobacco, such as cigarettes and e-cigarettes. If you need help quitting, ask your doctor.  Do not drink alcohol.  Do not douche or use tampons or scented sanitary pads.  Do not cross your legs for long periods of time.  Do not travel for long distances unless you must. Only do so if your doctor says it is okay.  Visit your dentist if you have not gone during your pregnancy. Use a soft toothbrush to brush your teeth. Be gentle when you floss.  Avoid cat litter boxes and soil used by cats. These carry germs that can cause birth defects in the baby and can cause a loss of your baby (miscarriage) or stillbirth.  Keep all your prenatal visits as told by your doctor. This is important. Contact a doctor if:  You are not sure if you are in labor or if your water has broken.  You are dizzy.  You have mild cramps or pressure in your lower belly.  You have a nagging pain in your belly area.  You continue to feel sick to your stomach, you throw up, or you have watery poop.  You have bad smelling fluid coming from your vagina.  You have pain when you pee. Get help right away if:  You have a fever.  You are leaking fluid from your vagina.  You are spotting or bleeding from your vagina.  You have severe belly cramps or pain.  You lose or gain weight quickly.  You have trouble catching your breath and have chest pain.  You notice sudden or extreme puffiness (swelling) of your face, hands, ankles, feet, or legs.  You have not felt the baby move in over an hour.  You have severe headaches that do not go away with medicine.  You have trouble seeing.  You are leaking, or you are having a gush of fluid, from your vagina before you are 37 weeks.  You have regular belly spasms (contractions) before you are 37 weeks. Summary  The third trimester is from week 28 through week 40 (months  7 through 9). This time is when your unborn baby is growing very fast.  Follow your doctor's advice about medicine, food, and activity.  Get ready for the arrival of your baby by taking prenatal classes, getting all the baby items ready, preparing the baby's room, and visiting your doctor to be checked.  Get help right away if you are bleeding from your vagina, or you have chest pain and trouble catching your breath, or if you have not felt your baby move in over an hour. This information is not intended to replace advice given to you by your health care provider. Make sure you discuss any questions you have with your health care provider. Document Revised: 01/19/2019 Document Reviewed: 11/03/2016 [employer] Patient Education  2020 [employer]  

## 2020-05-30 LAB — CERVICOVAGINAL ANCILLARY ONLY
Chlamydia: NEGATIVE
Comment: NEGATIVE
Comment: NORMAL
Neisseria Gonorrhea: NEGATIVE

## 2020-05-31 LAB — STREP GP B NAA: Strep Gp B NAA: NEGATIVE

## 2020-06-03 ENCOUNTER — Other Ambulatory Visit: Payer: Self-pay

## 2020-06-03 ENCOUNTER — Encounter: Payer: Self-pay | Admitting: *Deleted

## 2020-06-03 ENCOUNTER — Ambulatory Visit: Payer: Medicaid Other | Admitting: *Deleted

## 2020-06-03 ENCOUNTER — Ambulatory Visit: Payer: Medicaid Other | Attending: Obstetrics and Gynecology

## 2020-06-03 DIAGNOSIS — O24415 Gestational diabetes mellitus in pregnancy, controlled by oral hypoglycemic drugs: Secondary | ICD-10-CM | POA: Insufficient documentation

## 2020-06-03 DIAGNOSIS — Z3A36 36 weeks gestation of pregnancy: Secondary | ICD-10-CM

## 2020-06-03 DIAGNOSIS — Z362 Encounter for other antenatal screening follow-up: Secondary | ICD-10-CM

## 2020-06-04 ENCOUNTER — Ambulatory Visit (INDEPENDENT_AMBULATORY_CARE_PROVIDER_SITE_OTHER): Payer: Self-pay | Admitting: Pediatrics

## 2020-06-04 DIAGNOSIS — Z7681 Expectant parent(s) prebirth pediatrician visit: Secondary | ICD-10-CM

## 2020-06-06 ENCOUNTER — Encounter: Payer: Self-pay | Admitting: Family Medicine

## 2020-06-06 ENCOUNTER — Inpatient Hospital Stay (EMERGENCY_DEPARTMENT_HOSPITAL)
Admission: AD | Admit: 2020-06-06 | Discharge: 2020-06-06 | Disposition: A | Discharge status: Home / Self Care | Payer: Medicaid Other | Source: Home / Self Care | Attending: Obstetrics and Gynecology | Admitting: Obstetrics and Gynecology

## 2020-06-06 ENCOUNTER — Encounter (HOSPITAL_COMMUNITY): Payer: Self-pay | Admitting: Obstetrics and Gynecology

## 2020-06-06 ENCOUNTER — Other Ambulatory Visit: Payer: Self-pay | Admitting: Advanced Practice Midwife

## 2020-06-06 ENCOUNTER — Telehealth (INDEPENDENT_AMBULATORY_CARE_PROVIDER_SITE_OTHER): Payer: Medicaid Other | Admitting: Family Medicine

## 2020-06-06 ENCOUNTER — Other Ambulatory Visit: Payer: Self-pay

## 2020-06-06 VITALS — BP 149/90

## 2020-06-06 DIAGNOSIS — Z20822 Contact with and (suspected) exposure to covid-19: Secondary | ICD-10-CM | POA: Insufficient documentation

## 2020-06-06 DIAGNOSIS — Z3A37 37 weeks gestation of pregnancy: Secondary | ICD-10-CM

## 2020-06-06 DIAGNOSIS — J45909 Unspecified asthma, uncomplicated: Secondary | ICD-10-CM

## 2020-06-06 DIAGNOSIS — L299 Pruritus, unspecified: Secondary | ICD-10-CM

## 2020-06-06 DIAGNOSIS — Z79899 Other long term (current) drug therapy: Secondary | ICD-10-CM | POA: Insufficient documentation

## 2020-06-06 DIAGNOSIS — O99519 Diseases of the respiratory system complicating pregnancy, unspecified trimester: Secondary | ICD-10-CM

## 2020-06-06 DIAGNOSIS — O99513 Diseases of the respiratory system complicating pregnancy, third trimester: Secondary | ICD-10-CM

## 2020-06-06 DIAGNOSIS — O133 Gestational [pregnancy-induced] hypertension without significant proteinuria, third trimester: Secondary | ICD-10-CM | POA: Insufficient documentation

## 2020-06-06 DIAGNOSIS — O24415 Gestational diabetes mellitus in pregnancy, controlled by oral hypoglycemic drugs: Secondary | ICD-10-CM

## 2020-06-06 DIAGNOSIS — Z7984 Long term (current) use of oral hypoglycemic drugs: Secondary | ICD-10-CM | POA: Insufficient documentation

## 2020-06-06 DIAGNOSIS — Z8249 Family history of ischemic heart disease and other diseases of the circulatory system: Secondary | ICD-10-CM | POA: Insufficient documentation

## 2020-06-06 DIAGNOSIS — Z3480 Encounter for supervision of other normal pregnancy, unspecified trimester: Secondary | ICD-10-CM

## 2020-06-06 DIAGNOSIS — R030 Elevated blood-pressure reading, without diagnosis of hypertension: Secondary | ICD-10-CM

## 2020-06-06 DIAGNOSIS — O99713 Diseases of the skin and subcutaneous tissue complicating pregnancy, third trimester: Secondary | ICD-10-CM

## 2020-06-06 DIAGNOSIS — O99891 Other specified diseases and conditions complicating pregnancy: Secondary | ICD-10-CM

## 2020-06-06 LAB — CBC
HCT: 39.5 % (ref 36.0–46.0)
Hemoglobin: 13.2 g/dL (ref 12.0–15.0)
MCH: 29.5 pg (ref 26.0–34.0)
MCHC: 33.4 g/dL (ref 30.0–36.0)
MCV: 88.4 fL (ref 80.0–100.0)
Platelets: 197 10*3/uL (ref 150–400)
RBC: 4.47 MIL/uL (ref 3.87–5.11)
RDW: 13.6 % (ref 11.5–15.5)
WBC: 6.1 10*3/uL (ref 4.0–10.5)
nRBC: 0 % (ref 0.0–0.2)

## 2020-06-06 LAB — URINALYSIS, ROUTINE W REFLEX MICROSCOPIC
Bilirubin Urine: NEGATIVE
Glucose, UA: NEGATIVE mg/dL
Hgb urine dipstick: NEGATIVE
Ketones, ur: NEGATIVE mg/dL
Nitrite: NEGATIVE
Protein, ur: NEGATIVE mg/dL
Specific Gravity, Urine: 1.012 (ref 1.005–1.030)
pH: 6 (ref 5.0–8.0)

## 2020-06-06 LAB — COMPREHENSIVE METABOLIC PANEL
ALT: 18 U/L (ref 0–44)
AST: 17 U/L (ref 15–41)
Albumin: 2.8 g/dL — ABNORMAL LOW (ref 3.5–5.0)
Alkaline Phosphatase: 126 U/L (ref 38–126)
Anion gap: 10 (ref 5–15)
BUN: <5 mg/dL — ABNORMAL LOW (ref 6–20)
CO2: 19 mmol/L — ABNORMAL LOW (ref 22–32)
Calcium: 9.4 mg/dL (ref 8.9–10.3)
Chloride: 105 mmol/L (ref 98–111)
Creatinine, Ser: 0.8 mg/dL (ref 0.44–1.00)
GFR calc Af Amer: >60 mL/min (ref >60)
GFR calc non Af Amer: >60 mL/min (ref >60)
Glucose, Bld: 118 mg/dL — ABNORMAL HIGH (ref 70–99)
Potassium: 3.8 mmol/L (ref 3.5–5.1)
Sodium: 134 mmol/L — ABNORMAL LOW (ref 135–145)
Total Bilirubin: 0.4 mg/dL (ref 0.3–1.2)
Total Protein: 6.8 g/dL (ref 6.5–8.1)

## 2020-06-06 LAB — PROTEIN / CREATININE RATIO, URINE
Creatinine, Urine: 100.66 mg/dL
Protein Creatinine Ratio: 0.11 mg/mg{Cre} (ref 0.00–0.15)
Total Protein, Urine: 11 mg/dL

## 2020-06-06 LAB — SARS CORONAVIRUS 2 BY RT PCR (HOSPITAL ORDER, PERFORMED IN ~~LOC~~ HOSPITAL LAB): SARS Coronavirus 2: NEGATIVE

## 2020-06-06 NOTE — Progress Notes (Signed)
OBSTETRICS PRENATAL VIRTUAL VISIT ENCOUNTER NOTE  Provider location: [facility]   I connected with Rachel Neal on 06/06/20 at  2:55 PM EDT by MyChart Video Encounter at home and verified that I am speaking with the correct person using two identifiers.   I discussed the limitations, risks, security and privacy concerns of performing an evaluation and management service virtually and the availability of in person appointments. I also discussed with the patient that there may be a patient responsible charge related to this service. The patient expressed understanding and agreed to proceed. Subjective:  Rachel Neal is a 29 y.o. G2P0010 at [redacted]w[redacted]d being seen today for ongoing prenatal care.  She is currently monitored for the following issues for this high-risk pregnancy and has Supervision of other normal pregnancy, antepartum; Asthma affecting pregnancy, antepartum; Elevated BP without diagnosis of hypertension; Gestational diabetes mellitus (GDM) in third trimester controlled on oral hypoglycemic drug; and Round ligament pain on their problem list.  Patient reports complaints as noted below..  Contractions: Not present. Vag. Bleeding: None.  Movement: Present. Denies any leaking of fluid.   Itching of palms and soles -started 2 days ago -no issues prior in pregnancy -palms and soles -has since improved -did not take any meds for the itching  Elevated BP -no cp, sob, palpitations -no headaches or vision changes -has intermittently had systolic BP 130s -no history of hypertension -never been on BP meds The following portions of the patient's history were reviewed and updated as appropriate: allergies, current medications, past family history, past medical history, past social history, past surgical history and problem list.   A2GDM -taking metformin 500 mg BID -FBGL-70-94 -2hr PPBGL- 93-119  Objective:   Vitals:   06/06/20 1326  BP: (!) 147/90     Fetal Status:     Movement: Present     General:  Alert, oriented and cooperative. Patient is in no acute distress.  Respiratory: Normal respiratory effort, no problems with respiration noted  Mental Status: Normal mood and affect. Normal behavior. Normal judgment and thought content.  Rest of physical exam deferred due to type of encounter  Imaging: [place] MFM FETAL BPP WO NON STRESS  Result Date: 06/03/2020 ----------------------------------------------------------------------  OBSTETRICS REPORT                       (Signed Final 06/03/2020 06:07 pm) ---------------------------------------------------------------------- Patient Info  ID #:       [id number]                          D.O.B.:  1991-04-14 (28 yrs)  Name:       Rachel Neal                  Visit Date: 06/03/2020 03:22 pm ---------------------------------------------------------------------- Performed By  Attending:        Ma Rings MD         Ref. Address:     [address]  [address]                                                             [place]                                                             [address]  Performed By:     Sandi Mealy        [facility]  Referred By:      [facility] ---------------------------------------------------------------------- Orders  #  Description                           Code        Ordered By  1  [place] MFM OB FOLLOW UP                   76816.01    RAVI SHANKAR  2  [place] MFM FETAL BPP WO NON               76819.01    RAVI [facility]     STRESS ----------------------------------------------------------------------  #  Order #                      Accession #                Episode #  1  [id number]                   [id number]                 [id number]  2  [id number]                   [id number]                 [id number] ---------------------------------------------------------------------- Indications  Gestational diabetes in pregnancy,             O24.415  controlled by oral hypoglycemic drugs  Encounter for other antenatal screening        Z36.2  follow-up  [redacted] weeks gestation of pregnancy                Z3A.36 ---------------------------------------------------------------------- Fetal Evaluation  Num Of Fetuses:         1  Fetal Heart Rate(bpm):  137  Cardiac Activity:       Observed  Presentation:           Cephalic  Placenta:               Posterior  P. Cord Insertion:      Previously Visualized  Amniotic Fluid  AFI FV:      Within normal limits  AFI Sum(cm)     %Tile       Largest Pocket(cm)  19.35           73          6.3  RUQ(cm)       RLQ(cm)       LUQ(cm)        LLQ(cm)  6.3           4.42          5.69           2.94 ---------------------------------------------------------------------- Biophysical Evaluation  Amniotic F.V:   Pocket => 2 cm             F. Tone:        Observed  F. Movement:    Observed                   Score:          8/8  F. Breathing:   Observed ---------------------------------------------------------------------- Biometry  BPD:      88.4  mm     G. Age:  35w 5d         48  %    CI:        87.78   %    70 - 86                                                          FL/HC:      24.6   %    20.1 - 22.1  HC:      296.9  mm     G. Age:  32w 6d        < 1  %    HC/AC:      0.88        0.93 - 1.11  AC:      338.7  mm     G. Age:  37w 5d         93  %    FL/BPD:     82.5   %    71 - 87  FL:       72.9  mm     G. Age:  37w 2d         76  %    FL/AC:      21.5   %    20 - 24  HUM:      62.7  mm     G. Age:  52w 3d  68  %  Est. FW:    3046  gm    6 lb 11 oz      71  %  ---------------------------------------------------------------------- OB History  Blood Type:   A+  Gravidity:    2         Term:   0        Prem:   0        SAB:   0  TOP:          1       Ectopic:  0        Living: 0 ---------------------------------------------------------------------- Gestational Age  U/S Today:     35w 6d                                        EDD:   07/02/20  Best:          36w 1d     Det. By:  U/S  (02/01/20)          EDD:   06/30/20 ---------------------------------------------------------------------- Anatomy  Cranium:               Appears normal         Aortic Arch:            Previously seen  Cavum:                 Previously seen        Ductal Arch:            Previously seen  Ventricles:            Previously seen        Diaphragm:              Appears normal  Choroid Plexus:        Previously seen        Stomach:                Appears normal, left                                                                        sided  Cerebellum:            Previously seen        Abdomen:                Previously seen  Posterior Fossa:       Previously seen        Abdominal Wall:         Previously seen  Nuchal Fold:           Not applicable (>20    Cord Vessels:           Previously seen                         wks GA)  Face:                  Orbits previously      Kidneys:  Appear normal                         seen  Lips:                  Previously seen        Bladder:                Appears normal  Thoracic:              Previously seen        Spine:                  Previously seen                         Appears normal  Heart:                 Previously seen        Upper Extremities:      Previously seen  RVOT:                  Previously seen        Lower Extremities:      Previously seen  LVOT:                  Previously seen  Other:  Female gender visualized prev. Nasal bone and Open hands          visualized prev. Heels and 5th digit visualized prev.   Technically           difficult due to fetal position. ---------------------------------------------------------------------- Comments  This patient was seen for a follow up growth scan and  biophysical profile due to gestational diabetes that is currently  treated with Metformin.  The fetal growth and amniotic fluid level appears appropriate  for her gestational age.  A biophysical profile performed today was 8 out of 8.  She will return in 1 week for another biophysical profile. ----------------------------------------------------------------------                   Ma Rings, MD Electronically Signed Final Report   06/03/2020 06:07 pm ----------------------------------------------------------------------  [place] MFM FETAL BPP WO NON STRESS  Result Date: 05/27/2020 ----------------------------------------------------------------------  OBSTETRICS REPORT                       (Signed Final 05/27/2020 04:34 pm) ---------------------------------------------------------------------- Patient Info  ID #:       [id number]                          D.O.B.:  30-Oct-1990 (28 yrs)  Name:       Rachel Neal                  Visit Date: 05/27/2020 03:57 pm ---------------------------------------------------------------------- Performed By  Attending:        Noralee Space MD        Ref. Address:     [address]  [address]                                                             [place]                                                             [address]  Performed By:     Sandi Mealy        [facility]  Referred By:      [facility]  ---------------------------------------------------------------------- Orders  #  Description                           Code        Ordered By  1  [place] MFM FETAL BPP WO NON               76819.01    RAVI [facility]     STRESS ----------------------------------------------------------------------  #  Order #                     Accession #                Episode #  1  [id number]                   [id number]                 [id number] ---------------------------------------------------------------------- Indications  [redacted] weeks gestation of pregnancy                Z3A.35  Gestational diabetes in pregnancy,             O24.415  controlled by oral hypoglycemic drugs ---------------------------------------------------------------------- Fetal Evaluation  Num Of Fetuses:         1  Fetal Heart Rate(bpm):  136  Cardiac Activity:       Observed  Presentation:           Cephalic  Placenta:               Posterior  P. Cord Insertion:      Previously Visualized  Amniotic Fluid  AFI FV:      Within normal limits  AFI Sum(cm)     %Tile       Largest Pocket(cm)  18.61           69          6.05  RUQ(cm)       RLQ(cm)       LUQ(cm)        LLQ(cm)  4.6           3.06          6.05           4.9 ---------------------------------------------------------------------- Biophysical Evaluation  Amniotic F.V:   Pocket => 2 cm             F. Tone:        Observed  F. Movement:    Observed                   Score:          8/8  F. Breathing:   Observed ---------------------------------------------------------------------- OB History  Blood Type:   A+  Gravidity:    2         Term:   0        Prem:   0        SAB:   0  TOP:          1       Ectopic:  0        Living: 0 ---------------------------------------------------------------------- Gestational Age  Best:          35w 1d     Det. By:  U/S  (02/01/20)          EDD:   06/30/20 ---------------------------------------------------------------------- Impression  Gestational diabetes. Patient takes  metformin for control.  Amniotic fluid is normal and good fetal activity is seen  .Antenatal testing is reassuring. BPP 8/8. Cephalic  presentation. ---------------------------------------------------------------------- Recommendations  -Continue weekly BPP till delivery. ----------------------------------------------------------------------                  Noralee Spaceavi Shankar, MD Electronically Signed Final Report   05/27/2020 04:34 pm ----------------------------------------------------------------------  US MFM FETAL BPP WO NON STRESS  Result Date: 05/20/2020 ----------------------------------------------------------------------  OBSTETRICS REPORT                       (Signed Final 05/20/2020 04:17 pm) ---------------------------------------------------------------------- Patient Info  ID #:       [id number]                          D.O.B.:  09-17-1991 (28 yrs)  Name:       Rachel DraftYASMINE Streicher                  Visit Date: 05/20/2020 03:31 pm ---------------------------------------------------------------------- Performed By  Attending:        Ma RingsVictor Fang MD         Ref. Address:     [address]  [address]                                                             [address]                                                             [place]                                                             [address]  Performed By:     Hurman Horn          [facility]  Referred By:      [facility] ---------------------------------------------------------------------- Orders  #  Description                           Code        Ordered By  1  [place] MFM FETAL BPP WO NON               76819.01    RAVI [facility]     STRESS  ----------------------------------------------------------------------  #  Order #                     Accession #                Episode #  1  [id number]                   [id number]                 [id number] ---------------------------------------------------------------------- Indications  Gestational diabetes in pregnancy,             O24.415  controlled by oral hypoglycemic drugs  [redacted] weeks gestation  of pregnancy                Z3A.34 ---------------------------------------------------------------------- Fetal Evaluation  Num Of Fetuses:         1  Fetal Heart Rate(bpm):  130  Cardiac Activity:       Observed  Presentation:           Cephalic  Amniotic Fluid  AFI FV:      Within normal limits  AFI Sum(cm)     %Tile       Largest Pocket(cm)  13.94           48          6.83  RUQ(cm)       RLQ(cm)       LUQ(cm)        LLQ(cm)  6.83          1.82          1.53           3.76 ---------------------------------------------------------------------- Biophysical Evaluation  Amniotic F.V:   Within normal limits       F. Tone:        Observed  F. Movement:    Observed                   Score:          8/8  F. Breathing:   Observed ---------------------------------------------------------------------- OB History  Blood Type:   A+  Gravidity:    2         Term:   0        Prem:   0        SAB:   0  TOP:          1       Ectopic:  0        Living: 0 ---------------------------------------------------------------------- Gestational Age  Best:          34w 1d     Det. By:  U/S  (02/01/20)          EDD:   06/30/20 ---------------------------------------------------------------------- Comments  This patient was seen for a biophysical profile due to  gestational diabetes that is currently treated with Metformin.  She denies any problems since her last exam.  A biophysical profile performed today was 8 out of 8.  There was normal amniotic fluid noted on today's ultrasound  exam.  Another biophysical profile scheduled in 1 week.  ----------------------------------------------------------------------                   Ma Rings, MD Electronically Signed Final Report   05/20/2020 04:17 pm ----------------------------------------------------------------------  [place] MFM FETAL BPP WO NON STRESS  Result Date: 05/13/2020 ----------------------------------------------------------------------  OBSTETRICS REPORT                       (Signed Final 05/13/2020 10:32 am) ---------------------------------------------------------------------- Patient Info  ID #:       [id number]                          D.O.B.:  08-17-1991 (28 yrs)  Name:       Rachel Neal                  Visit Date: 05/13/2020 09:47 am ---------------------------------------------------------------------- Performed By  Attending:        Ma Rings MD         Ref. Address:  [address]                                                             [address]                                                             [place]                                                             [address]  Performed By:     Tommie Raymond BS,       [facility]                                [facility]  Referred By:      [facility] ---------------------------------------------------------------------- Orders  #  Description                           Code        Ordered By  1  [place] MFM FETAL BPP WO NON               [address].09    RAVI [facility]     STRESS ----------------------------------------------------------------------  #  Order #                     Accession #                Episode #  1  [id number]  [id number]                 [id number] ---------------------------------------------------------------------- Indications   Gestational diabetes in pregnancy,             O24.415  controlled by oral hypoglycemic drugs  [redacted] weeks gestation of pregnancy                Z3A.33  Encounter for other antenatal screening        Z36.2  follow-up ---------------------------------------------------------------------- Fetal Evaluation  Num Of Fetuses:         1  Fetal Heart Rate(bpm):  142  Cardiac Activity:       Observed  Presentation:           Cephalic  Placenta:               Posterior  P. Cord Insertion:      Previously Visualized  Amniotic Fluid  AFI FV:      Within normal limits  AFI Sum(cm)     %Tile       Largest Pocket(cm)  14.65           52          5.43  RUQ(cm)       RLQ(cm)       LUQ(cm)        LLQ(cm)  3.14          2.91          3.17           5.43 ---------------------------------------------------------------------- Biophysical Evaluation  Amniotic F.V:   Pocket => 2 cm             F. Tone:        Observed  F. Movement:    Observed                   Score:          8/8  F. Breathing:   Observed ---------------------------------------------------------------------- OB History  Blood Type:   A+  Gravidity:    2         Term:   0        Prem:   0        SAB:   0  TOP:          1       Ectopic:  0        Living: 0 ---------------------------------------------------------------------- Gestational Age  Best:          33w 1d     Det. By:  U/S  (02/01/20)          EDD:   06/30/20 ---------------------------------------------------------------------- Anatomy  Cranium:               Previously seen        Aortic Arch:            Previously seen  Cavum:                 Previously seen        Ductal Arch:            Previously seen  Ventricles:            Appears normal         Diaphragm:              Appears normal  Choroid Plexus:        Previously seen  Stomach:                Appears normal, left                                                                        sided  Cerebellum:            Previously seen        Abdomen:                 Previously seen  Posterior Fossa:       Previously seen        Abdominal Wall:         Previously seen  Nuchal Fold:           Not applicable (>20    Cord Vessels:           Previously seen                         wks GA)  Face:                  Orbits previously      Kidneys:                Appear normal                         seen  Lips:                  Previously seen        Bladder:                Appears normal  Thoracic:              Previously seen        Spine:                  Previously seen  Heart:                 Appears normal         Upper Extremities:      Previously seen                         (4CH, axis, and                         situs)  RVOT:                  Previously seen        Lower Extremities:      Previously seen  LVOT:                  Previously seen  Other:  Female gender visualized prev. Nasal bone and Open hands          visualized prev. Heels and 5th digit visualized prev.   Technically          difficult due to fetal position. ---------------------------------------------------------------------- Cervix Uterus Adnexa  Cervix  Normal appearance by transabdominal scan.  Uterus  No abnormality visualized.  Right Ovary  No adnexal mass visualized.  Left Ovary  No adnexal mass visualized.  Cul De Sac  No free fluid seen.  Adnexa  No abnormality visualized. ---------------------------------------------------------------------- Comments  This patient was seen for a biophysical profile due to  gestational diabetes that is currently treated with Metformin.  She reports that her fingerstick values have mostly been  within normal limits.  She denies any problems since her last  exam.  A biophysical profile performed today was 8 out of 8.  There was normal amniotic fluid noted on today's ultrasound  exam.  Another biophysical profile was scheduled in 1 week. ----------------------------------------------------------------------                   Ma Rings, MD Electronically  Signed Final Report   05/13/2020 10:32 am ----------------------------------------------------------------------  [place] MFM OB FOLLOW UP  Result Date: 06/03/2020 ----------------------------------------------------------------------  OBSTETRICS REPORT                       (Signed Final 06/03/2020 06:07 pm) ---------------------------------------------------------------------- Patient Info  ID #:       [id number]                          D.O.B.:  [birth year] (28 yrs)  Name:       Rachel Neal                  Visit Date: 06/03/2020 03:22 pm ---------------------------------------------------------------------- Performed By  Attending:        Ma Rings MD         Ref. Address:     [address]                                                             [address]                                                             [place]                                                             [address]  Performed By:     Sandi Mealy        [facility]  Referred By:      [facility] ---------------------------------------------------------------------- Orders  #  Description                           Code        Ordered By  1  [place] MFM OB FOLLOW UP                   76816.01    RAVI SHANKAR  2  [place] MFM FETAL BPP WO NON               76819.01    RAVI [facility]     STRESS ----------------------------------------------------------------------  #  Order #                     Accession #                Episode #  1  [id number]                   [id number]                 [id number]  2  [id number]                   [id number]                 [id number] ----------------------------------------------------------------------  Indications  Gestational diabetes in pregnancy,             O24.415  controlled by oral hypoglycemic drugs  Encounter for other antenatal screening        Z36.2  follow-up  [redacted] weeks gestation of pregnancy                Z3A.36 ---------------------------------------------------------------------- Fetal Evaluation  Num Of Fetuses:         1  Fetal Heart Rate(bpm):  137  Cardiac Activity:       Observed  Presentation:           Cephalic  Placenta:               Posterior  P. Cord Insertion:      Previously Visualized  Amniotic Fluid  AFI FV:      Within normal limits  AFI Sum(cm)     %Tile       Largest Pocket(cm)  19.35           73          6.3  RUQ(cm)       RLQ(cm)       LUQ(cm)        LLQ(cm)  6.3           4.42  5.69           2.94 ---------------------------------------------------------------------- Biophysical Evaluation  Amniotic F.V:   Pocket => 2 cm             F. Tone:        Observed  F. Movement:    Observed                   Score:          8/8  F. Breathing:   Observed ---------------------------------------------------------------------- Biometry  BPD:      88.4  mm     G. Age:  35w 5d         48  %    CI:        87.78   %    70 - 86                                                          FL/HC:      24.6   %    20.1 - 22.1  HC:      296.9  mm     G. Age:  32w 6d        < 1  %    HC/AC:      0.88        0.93 - 1.11  AC:      338.7  mm     G. Age:  37w 5d         93  %    FL/BPD:     82.5   %    71 - 87  FL:       72.9  mm     G. Age:  37w 2d         76  %    FL/AC:      21.5   %    20 - 24  HUM:      62.7  mm     G. Age:  36w 3d         68  %  Est. FW:    3046  gm    6 lb 11 oz      71  % ---------------------------------------------------------------------- OB History  Blood Type:   A+  Gravidity:    2         Term:   0        Prem:   0        SAB:   0  TOP:          1       Ectopic:  0        Living: 0 ---------------------------------------------------------------------- Gestational Age   U/S Today:     35w 6d                                        EDD:   07/02/20  Best:          36w 1d     Det. By:  U/S  (02/01/20)          EDD:   06/30/20 ---------------------------------------------------------------------- Anatomy  Cranium:  Appears normal         Aortic Arch:            Previously seen  Cavum:                 Previously seen        Ductal Arch:            Previously seen  Ventricles:            Previously seen        Diaphragm:              Appears normal  Choroid Plexus:        Previously seen        Stomach:                Appears normal, left                                                                        sided  Cerebellum:            Previously seen        Abdomen:                Previously seen  Posterior Fossa:       Previously seen        Abdominal Wall:         Previously seen  Nuchal Fold:           Not applicable (>20    Cord Vessels:           Previously seen                         wks GA)  Face:                  Orbits previously      Kidneys:                Appear normal                         seen  Lips:                  Previously seen        Bladder:                Appears normal  Thoracic:              Previously seen        Spine:                  Previously seen                         Appears normal  Heart:                 Previously seen        Upper Extremities:      Previously seen  RVOT:                  Previously seen        Lower Extremities:  Previously seen  LVOT:                  Previously seen  Other:  Female gender visualized prev. Nasal bone and Open hands          visualized prev. Heels and 5th digit visualized prev.   Technically          difficult due to fetal position. ---------------------------------------------------------------------- Comments  This patient was seen for a follow up growth scan and  biophysical profile due to gestational diabetes that is currently  treated with Metformin.  The fetal growth and amniotic fluid  level appears appropriate  for her gestational age.  A biophysical profile performed today was 8 out of 8.  She will return in 1 week for another biophysical profile. ----------------------------------------------------------------------                   Ma Rings, MD Electronically Signed Final Report   06/03/2020 06:07 pm ----------------------------------------------------------------------   Assessment and Plan:  Pregnancy: G2P0010 at [redacted]w[redacted]d 1. Gestational diabetes mellitus (GDM) in third trimester controlled on oral hypoglycemic drug -Compliant with metformin, BGL well controlled as noted above. -Last EFW 3046 (71%ile) at [redacted]w[redacted]d  2. Supervision of other normal pregnancy, antepartum -Currently IOL planned for 39w in the setting of A2GDM, not scheduled -Will direct patient to MAU at this time, see below  3. Elevated BP without diagnosis of hypertension -Multiple mildly elevated readings systolic 130s during prior visits -has never had preE labs -Today initially elevated to 147/90 and upon repeat 149/93, asymptomatic -Given patient has never had BP elevated this high and is checking on home cuff, will direct to MAU for further evaluation and lab work at this time. -No availability for in person clinic appt today -may need sooner IOL than 39w  4. Asthma affecting pregnancy, antepartum Albuterol PRN. Has not had to use recently. History of multiple hospitalizations for asthma, never intubated, last 2017.  5. Pruritis of palms and soles -new problem the past 2 days -has had some improvement -needs bile acids, would need IOL sooner  Term labor symptoms and general obstetric precautions including but not limited to vaginal bleeding, contractions, leaking of fluid and fetal movement were reviewed in detail with the patient. I discussed the assessment and treatment plan with the patient. The patient was provided an opportunity to ask questions and all were answered. The patient agreed with  the plan and demonstrated an understanding of the instructions. The patient was advised to call back or seek an in-person office evaluation/go to MAU at [facility] for any urgent or concerning symptoms. Please refer to After Visit Summary for other counseling recommendations.   I provided 20 minutes of face-to-face time during this encounter.  No follow-ups on file.  Future Appointments  Date Time Provider Department Center  06/06/2020  2:55 PM Alric Seton, MD [facility] None  06/12/2020  1:30 PM [facility] NURSE [facility]  06/12/2020  1:45 PM [facility] US4 [facility]  06/19/2020  1:30 PM [facility] NURSE [facility]  06/19/2020  1:45 PM [facility] US4 [facility]    Alric Seton, MD Center for [employer], [facility]

## 2020-06-06 NOTE — Discharge Instructions (Signed)
Labor Induction  Labor induction is when steps are taken to cause a pregnant woman to begin the labor process. Most women go into labor on their own between 37 weeks and 42 weeks of pregnancy. When this does not happen or when there is a medical need for labor to begin, steps may be taken to induce labor. Labor induction causes a pregnant woman's uterus to contract. It also causes the cervix to soften (ripen), open (dilate), and thin out (efface). Usually, labor is not induced before 39 weeks of pregnancy unless there is a medical reason to do so. Your health care provider will determine if labor induction is needed. Before inducing labor, your health care provider will consider a number of factors, including:  Your medical condition and your baby's.  How many weeks along you are in your pregnancy.  How mature your baby's lungs are.  The condition of your cervix.  The position of your baby.  The size of your birth canal. What are some reasons for labor induction? Labor may be induced if:  Your health or your baby's health is at risk.  Your pregnancy is overdue by 1 week or more.  Your water breaks but labor does not start on its own.  There is a low amount of amniotic fluid around your baby. You may also choose (elect) to have labor induced at a certain time. Generally, elective labor induction is done no earlier than 39 weeks of pregnancy. What methods are used for labor induction? Methods used for labor induction include:  Prostaglandin medicine. This medicine starts contractions and causes the cervix to dilate and ripen. It can be taken by mouth (orally) or by being inserted into the vagina (suppository).  Inserting a small, thin tube (catheter) with a balloon into the vagina and then expanding the balloon with water to dilate the cervix.  Stripping the membranes. In this method, your health care provider gently separates amniotic sac tissue from the cervix. This causes the  cervix to stretch, which in turn causes the release of a hormone called progesterone. The hormone causes the uterus to contract. This procedure is often done during an office visit, after which you will be sent home to wait for contractions to begin.  Breaking the water. In this method, your health care provider uses a small instrument to make a small hole in the amniotic sac. This eventually causes the amniotic sac to break. Contractions should begin after a few hours.  Medicine to trigger or strengthen contractions. This medicine is given through an IV that is inserted into a vein in your arm. Except for membrane stripping, which can be done in a clinic, labor induction is done in the hospital so that you and your baby can be carefully monitored. How long does it take for labor to be induced? The length of time it takes to induce labor depends on how ready your body is for labor. Some inductions can take up to 2-3 days, while others may take less than a day. Induction may take longer if:  You are induced early in your pregnancy.  It is your first pregnancy.  Your cervix is not ready. What are some risks associated with labor induction? Some risks associated with labor induction include:  Changes in fetal heart rate, such as being too high, too low, or irregular (erratic).  Failed induction.  Infection in the mother or the baby.  Increased risk of having a cesarean delivery.  Fetal death.  Breaking off (abruption)   of the placenta from the uterus (rare).  Rupture of the uterus (very rare). When induction is needed for medical reasons, the benefits of induction generally outweigh the risks. What are some reasons for not inducing labor? Labor induction should not be done if:  Your baby does not tolerate contractions.  You have had previous surgeries on your uterus, such as a myomectomy, removal of fibroids, or a vertical scar from a previous cesarean delivery.  Your placenta lies  very low in your uterus and blocks the opening of the cervix (placenta previa).  Your baby is not in a head-down position.  The umbilical cord drops down into the birth canal in front of the baby.  There are unusual circumstances, such as the baby being very early (premature).  You have had more than 2 previous cesarean deliveries. Summary  Labor induction is when steps are taken to cause a pregnant woman to begin the labor process.  Labor induction causes a pregnant woman's uterus to contract. It also causes the cervix to ripen, dilate, and efface.  Labor is not induced before 39 weeks of pregnancy unless there is a medical reason to do so.  When induction is needed for medical reasons, the benefits of induction generally outweigh the risks. This information is not intended to replace advice given to you by your health care provider. Make sure you discuss any questions you have with your health care provider. Document Revised: 10/01/2017 Document Reviewed: 11/11/2016 Elsevier Patient Education  2020 Elsevier Inc.  Hypertension During Pregnancy Hypertension is also called high blood pressure. High blood pressure means that the force of your blood moving in your body is too strong. It can cause problems for you and your baby. Different types of high blood pressure can happen during pregnancy. The types are:  High blood pressure before you got pregnant. This is called chronic hypertension.  This can continue during your pregnancy. Your doctor will want to keep checking your blood pressure. You may need medicine to keep your blood pressure under control while you are pregnant. You will need follow-up visits after you have your baby.  High blood pressure that goes up during pregnancy when it was normal before. This is called gestational hypertension. It will usually get better after you have your baby, but your doctor will need to watch your blood pressure to make sure that it is getting  better.  Very high blood pressure during pregnancy. This is called preeclampsia. Very high blood pressure is an emergency that needs to be checked and treated right away.  You may develop very high blood pressure after giving birth. This is called postpartum preeclampsia. This usually occurs within 48 hours after childbirth but may occur up to 6 weeks after giving birth. This is rare. How does this affect me? If you have high blood pressure during pregnancy, you have a higher chance of developing high blood pressure:  As you get older.  If you get pregnant again. In some cases, high blood pressure during pregnancy can cause:  Stroke.  Heart attack.  Damage to the kidneys, lungs, or liver.  Preeclampsia.  Jerky movements you cannot control (convulsions or seizures).  Problems with the placenta. How does this affect my baby? Your baby may:  Be born early.  Not weigh as much as he or she should.  Not handle labor well, leading to a c-section birth. What are the risks?  Having high blood pressure during a past pregnancy.  Being overweight.  Being 35   years old or older.  Being pregnant for the first time.  Being pregnant with more than one baby.  Becoming pregnant using fertility methods, such as IVF.  Having other problems, such as diabetes, or kidney disease.  Having family members who have high blood pressure. What can I do to lower my risk?   Keep a healthy weight.  Eat a healthy diet.  Follow what your doctor tells you about treating any medical problems that you had before becoming pregnant. It is very important to go to all of your doctor visits. Your doctor will check your blood pressure and make sure that your pregnancy is progressing as it should. Treatment should start early if a problem is found. How is this treated? Treatment for high blood pressure during pregnancy can differ depending on the type of high blood pressure you have and how serious it  is.  You may need to take blood pressure medicine.  If you have been taking medicine for your blood pressure, you may need to change the medicine during pregnancy if it is not safe for your baby.  If your doctor thinks that you could get very high blood pressure, he or she may tell you to take a low-dose aspirin during your pregnancy.  If you have very high blood pressure, you may need to stay in the hospital so you and your baby can be watched closely. You may also need to take medicine to lower your blood pressure. This medicine may be given by mouth or through an IV tube.  In some cases, if your condition gets worse, you may need to have your baby early. Follow these instructions at home: Eating and drinking   Drink enough fluid to keep your pee (urine) pale yellow.  Avoid caffeine. Lifestyle  Do not use any products that contain nicotine or tobacco, such as cigarettes, e-cigarettes, and chewing tobacco. If you need help quitting, ask your doctor.  Do not use alcohol or drugs.  Avoid stress.  Rest and get plenty of sleep.  Regular exercise can help. Ask your doctor what kinds of exercise are best for you. General instructions  Take over-the-counter and prescription medicines only as told by your doctor.  Keep all prenatal and follow-up visits as told by your doctor. This is important. Contact a doctor if:  You have symptoms that your doctor told you to watch for, such as: ? Headaches. ? Nausea. ? Vomiting. ? Belly (abdominal) pain. ? Dizziness. ? Light-headedness. Get help right away if:  You have: ? Very bad belly pain that does not get better with treatment. ? A very bad headache that does not get better. ? Vomiting that does not get better. ? Sudden, fast weight gain. ? Sudden swelling in your hands, ankles, or face. ? Bleeding from your vagina. ? Blood in your pee. ? Blurry vision. ? Double vision. ? Shortness of breath. ? Chest pain. ? Weakness on one  side of your body. ? Trouble talking.  Your baby is not moving as much as usual. Summary  High blood pressure is also called hypertension.  High blood pressure means that the force of your blood moving in your body is too strong.  High blood pressure can cause problems for you and your baby.  Keep all follow-up visits as told by your doctor. This is important. This information is not intended to replace advice given to you by your health care provider. Make sure you discuss any questions you have with your   health care provider. Document Revised: 01/19/2019 Document Reviewed: 10/25/2018 Elsevier Patient Education  2020 Elsevier Inc.  

## 2020-06-06 NOTE — Progress Notes (Signed)
Patient presents for ROB. Patient identified with 2 patient identifiers. Her bp today is 147/90 and repeat was the same. She denies having any headaches. Patient denies having any bleeding and LOF. She is taking her metformin daily with glucose readings within normal range per patient. Patient complains of having itching all over her body and her feet a couple of days ago, but as resolved.

## 2020-06-06 NOTE — MAU Provider Note (Signed)
History     CSN: [id number]  Arrival date and time: 06/06/20 1436  First Provider Initiated Contact with Patient 06/06/20 1523      Chief Complaint  Patient presents with  . Hypertension   HPI Rachel Neal is a 29 y.o. G2P0010 at [redacted]w[redacted]d who presents to MAU after discussing elevated blood pressures during her virtual visit with [facility] earlier today. Patient denies history of elevated blood pressures. She endorses one episode of "spots" one week ago which resolved with rest and PO hydration. On arrival to MAU she denies headache, visual disturbances, RUQ/epigastric pain, new onset swelling or weight fain. She also denies contraction pain, vaginal bleeding, leaking of fluid, decreased fetal movement, fever, falls, or recent illness.   She receives care with [facility].  OB History    Gravida  2   Para      Term      Preterm      AB  1   Living        SAB      TAB  1   Ectopic      Multiple      Live Births              Past Medical History:  Diagnosis Date  . Asthma   . Hx of chlamydia infection   . Low lying placenta, antepartum 02/02/2020    Past Surgical History:  Procedure Laterality Date  . KNEE SURGERY Left     Family History  Problem Relation Age of Onset  . High blood pressure Brother   . High blood pressure Sister   . Heart attack Father   . Asthma Father   . Diabetes Mother   . Diabetes Brother   . Breast cancer Maternal Aunt   . Colon polyps Sister   . Bone cancer Maternal Aunt     Social History   Tobacco Use  . Smoking status: Never Smoker  . Smokeless tobacco: Never Used  Vaping Use  . Vaping Use: Never used  Substance Use Topics  . Alcohol use: Yes    Comment: occ  . Drug use: Yes    Types: Marijuana    Allergies: No Known Allergies  Medications Prior to Admission  Medication Sig Dispense Refill Last Dose  . Accu-Chek Softclix Lancets lancets Use as instructed 100 each 12 06/06/2020 at Unknown time  . glucose  blood (ACCU-CHEK GUIDE) test strip Use to check blood sugars four times a day was instructed 100 each 12 06/06/2020 at Unknown time  . metFORMIN (GLUCOPHAGE) 500 MG tablet Take 1 tablet (500 mg total) by mouth daily with supper. (Patient taking differently: Take 500 mg by mouth 2 (two) times daily with a meal. ) 60 tablet 5 06/06/2020 at Unknown time  . albuterol (VENTOLIN HFA) 108 (90 Base) MCG/ACT inhaler Inhale 2 puffs into the lungs every 6 (six) hours as needed for wheezing. 8 g 0 More than a month at Unknown time    Review of Systems  Eyes: Negative for visual disturbance.  Gastrointestinal: Negative for abdominal pain.  Genitourinary: Negative for vaginal bleeding.  Musculoskeletal: Negative for back pain.  Neurological: Negative for headaches.  All other systems reviewed and are negative.  Physical Exam   Blood pressure 140/85, pulse 96, temperature 98.2 F (36.8 C), temperature source Oral, resp. rate 15, height 5\' 8"  (1.727 m), weight 103.7 kg, last menstrual period 09/21/2019, SpO2 100 %.  Physical Exam Vitals and nursing note reviewed. Exam conducted with  a chaperone present.  Constitutional:      Appearance: Normal appearance.  Cardiovascular:     Rate and Rhythm: Normal rate.     Pulses: Normal pulses.     Heart sounds: Normal heart sounds.  Pulmonary:     Effort: Pulmonary effort is normal.     Breath sounds: Normal breath sounds.  Abdominal:     Comments: Gravid  Neurological:     Mental Status: She is alert.     MAU Course  Procedures   --Mild elevated blood pressures noted 04/29 at 20 weeks current pregnancy, intermittent elevations since then --Reactive tracing: baseline 130, mod var, 15 x 15 accels, no decels --Toco: irregular mild contractions --Cervix 1/thick/posterior --Counseled on Gestational Hypertension diagnosis, indication for induction of labor during 37th week. Reviewed methods of IOL with orders placed for buccal Cytotec and foley balloon  placement  Patient Vitals for the past 24 hrs:  BP Temp Temp src Pulse Resp SpO2 Height Weight  06/06/20 1614 (!) 134/93 -- -- 82 15 100 % -- --  06/06/20 1601 (!) 134/93 -- -- 82 -- 100 % -- --  06/06/20 1546 (!) 154/92 -- -- 92 -- 100 % -- --  06/06/20 1530 (!) 137/92 -- -- 96 -- 99 % -- --  06/06/20 1503 140/85 98.2 F (36.8 C) Oral 96 15 100 % 5\' 8"  (1.727 m) 103.7 kg   Results for orders placed or performed during the hospital encounter of 06/06/20 (from the past 24 hour(s))  Urinalysis, Routine w reflex microscopic Urine, Clean Catch     Status: Abnormal   Collection Time: 06/06/20  2:56 PM  Result Value Ref Range   Color, Urine YELLOW YELLOW   APPearance HAZY (A) CLEAR   Specific Gravity, Urine 1.012 1.005 - 1.030   pH 6.0 5.0 - 8.0   Glucose, UA NEGATIVE NEGATIVE mg/dL   Hgb urine dipstick NEGATIVE NEGATIVE   Bilirubin Urine NEGATIVE NEGATIVE   Ketones, ur NEGATIVE NEGATIVE mg/dL   Protein, ur NEGATIVE NEGATIVE mg/dL   Nitrite NEGATIVE NEGATIVE   Leukocytes,Ua LARGE (A) NEGATIVE   RBC / HPF 0-5 0 - 5 RBC/hpf   WBC, UA 6-10 0 - 5 WBC/hpf   Bacteria, UA MANY (A) NONE SEEN   Squamous Epithelial / LPF 0-5 0 - 5   Mucus PRESENT   Protein / creatinine ratio, urine     Status: None   Collection Time: 06/06/20  2:58 PM  Result Value Ref Range   Creatinine, Urine 100.66 mg/dL   Total Protein, Urine 11 mg/dL   Protein Creatinine Ratio 0.11 0.00 - 0.15 mg/mg[Cre]  CBC     Status: None   Collection Time: 06/06/20  3:04 PM  Result Value Ref Range   WBC 6.1 4.0 - 10.5 K/uL   RBC 4.47 3.87 - 5.11 MIL/uL   Hemoglobin 13.2 12.0 - 15.0 g/dL   HCT 06/08/20 36 - 46 %   MCV 88.4 80.0 - 100.0 fL   MCH 29.5 26.0 - 34.0 pg   MCHC 33.4 30.0 - 36.0 g/dL   RDW 51.0 25.8 - 52.7 %   Platelets 197 150 - 400 K/uL   nRBC 0.0 0.0 - 0.2 %  Comprehensive metabolic panel     Status: Abnormal   Collection Time: 06/06/20  3:04 PM  Result Value Ref Range   Sodium 134 (L) 135 - 145 mmol/L    Potassium 3.8 3.5 - 5.1 mmol/L   Chloride 105 98 - 111 mmol/L  CO2 19 (L) 22 - 32 mmol/L   Glucose, Bld 118 (H) 70 - 99 mg/dL   BUN <5 (L) 6 - 20 mg/dL   Creatinine, Ser 0.51 0.44 - 1.00 mg/dL   Calcium 9.4 8.9 - 10.2 mg/dL   Total Protein 6.8 6.5 - 8.1 g/dL   Albumin 2.8 (L) 3.5 - 5.0 g/dL   AST 17 15 - 41 U/L   ALT 18 0 - 44 U/L   Alkaline Phosphatase 126 38 - 126 U/L   Total Bilirubin 0.4 0.3 - 1.2 mg/dL   GFR calc non Af Amer >60 >60 mL/min   GFR calc Af Amer >60 >60 mL/min   Anion gap 10 5 - 15   Assessment and Plan  --29 y.o. G2P0010 at [redacted]w[redacted]d  --Reactive tracing --GHTN, normal PEC labs, asymptomatic --A2GDM --Reactive tracing --Discharge home in stable condition  F/U: --Scheduled for IOL tomorrow morning at 0810. Indication: GHTN --Patient aware she should stay home until called --Cytotec + foley balloon, orders placed   Calvert Cantor, CNM 06/06/2020, 4:48 PM

## 2020-06-06 NOTE — Progress Notes (Signed)
IOL GHTN 06/07/2020  Clayton Bibles, MSN, CNM Certified Nurse Midwife, [facility] for [employer], [facility] 06/06/20 4:19 PM

## 2020-06-06 NOTE — MAU Note (Signed)
. °  Rachel Neal is a 29 y.o. at [redacted]w[redacted]d here in MAU reporting: Elevated BP on her virtual visit. Dr. Germaine Pomfret sent her in for evaluation. No HA or blurred vision. Has occasionally seen floaters in her vision, but not currently. No VB or LOF. Endorses good fetal movement.   Pain score: 0 Vitals:   06/06/20 1503  BP: 140/85  Pulse: 96  Resp: 15  Temp: 98.2 F (36.8 C)  SpO2: 100%     FHT:148 Lab orders placed from triage: UA

## 2020-06-07 ENCOUNTER — Inpatient Hospital Stay (HOSPITAL_COMMUNITY): Payer: Medicaid Other | Admitting: Anesthesiology

## 2020-06-07 ENCOUNTER — Inpatient Hospital Stay (HOSPITAL_COMMUNITY)
Admission: RE | Admit: 2020-06-07 | Discharge: 2020-06-09 | DRG: 807 | Disposition: A | Discharge status: Home / Self Care | Payer: Medicaid Other | Attending: Obstetrics & Gynecology | Admitting: Obstetrics & Gynecology

## 2020-06-07 ENCOUNTER — Inpatient Hospital Stay (HOSPITAL_COMMUNITY): Payer: Medicaid Other

## 2020-06-07 ENCOUNTER — Encounter (HOSPITAL_COMMUNITY): Payer: Self-pay | Admitting: Obstetrics & Gynecology

## 2020-06-07 DIAGNOSIS — O24415 Gestational diabetes mellitus in pregnancy, controlled by oral hypoglycemic drugs: Secondary | ICD-10-CM | POA: Diagnosis not present

## 2020-06-07 DIAGNOSIS — Z3480 Encounter for supervision of other normal pregnancy, unspecified trimester: Secondary | ICD-10-CM

## 2020-06-07 DIAGNOSIS — J45909 Unspecified asthma, uncomplicated: Secondary | ICD-10-CM | POA: Diagnosis present

## 2020-06-07 DIAGNOSIS — O24425 Gestational diabetes mellitus in childbirth, controlled by oral hypoglycemic drugs: Principal | ICD-10-CM | POA: Diagnosis present

## 2020-06-07 DIAGNOSIS — O134 Gestational [pregnancy-induced] hypertension without significant proteinuria, complicating childbirth: Secondary | ICD-10-CM | POA: Diagnosis present

## 2020-06-07 DIAGNOSIS — O9952 Diseases of the respiratory system complicating childbirth: Secondary | ICD-10-CM | POA: Diagnosis present

## 2020-06-07 DIAGNOSIS — Z3A37 37 weeks gestation of pregnancy: Secondary | ICD-10-CM

## 2020-06-07 DIAGNOSIS — Z20822 Contact with and (suspected) exposure to covid-19: Secondary | ICD-10-CM | POA: Diagnosis present

## 2020-06-07 DIAGNOSIS — O139 Gestational [pregnancy-induced] hypertension without significant proteinuria, unspecified trimester: Secondary | ICD-10-CM | POA: Diagnosis present

## 2020-06-07 DIAGNOSIS — O133 Gestational [pregnancy-induced] hypertension without significant proteinuria, third trimester: Secondary | ICD-10-CM | POA: Diagnosis not present

## 2020-06-07 HISTORY — DX: Essential (primary) hypertension: I10

## 2020-06-07 HISTORY — DX: Gestational (pregnancy-induced) hypertension without significant proteinuria, unspecified trimester: O13.9

## 2020-06-07 HISTORY — DX: Gestational diabetes mellitus in pregnancy, unspecified control: O24.419

## 2020-06-07 LAB — CBC
HCT: 38.2 % (ref 36.0–46.0)
Hemoglobin: 12.3 g/dL (ref 12.0–15.0)
MCH: 28.9 pg (ref 26.0–34.0)
MCHC: 32.2 g/dL (ref 30.0–36.0)
MCV: 89.9 fL (ref 80.0–100.0)
Platelets: 183 10*3/uL (ref 150–400)
RBC: 4.25 MIL/uL (ref 3.87–5.11)
RDW: 13.9 % (ref 11.5–15.5)
WBC: 6.6 10*3/uL (ref 4.0–10.5)
nRBC: 0 % (ref 0.0–0.2)

## 2020-06-07 LAB — TYPE AND SCREEN
ABO/RH(D): A POS
Antibody Screen: NEGATIVE

## 2020-06-07 LAB — COMPREHENSIVE METABOLIC PANEL
ALT: 17 U/L (ref 0–44)
AST: 19 U/L (ref 15–41)
Albumin: 2.7 g/dL — ABNORMAL LOW (ref 3.5–5.0)
Alkaline Phosphatase: 113 U/L (ref 38–126)
Anion gap: 11 (ref 5–15)
BUN: 5 mg/dL — ABNORMAL LOW (ref 6–20)
CO2: 18 mmol/L — ABNORMAL LOW (ref 22–32)
Calcium: 9.1 mg/dL (ref 8.9–10.3)
Chloride: 104 mmol/L (ref 98–111)
Creatinine, Ser: 0.78 mg/dL (ref 0.44–1.00)
GFR calc Af Amer: >60 mL/min (ref >60)
GFR calc non Af Amer: >60 mL/min (ref >60)
Glucose, Bld: 178 mg/dL — ABNORMAL HIGH (ref 70–99)
Potassium: 3.7 mmol/L (ref 3.5–5.1)
Sodium: 133 mmol/L — ABNORMAL LOW (ref 135–145)
Total Bilirubin: 0.3 mg/dL (ref 0.3–1.2)
Total Protein: 6.9 g/dL (ref 6.5–8.1)

## 2020-06-07 LAB — GLUCOSE, CAPILLARY
Glucose-Capillary: 112 mg/dL — ABNORMAL HIGH (ref 70–99)
Glucose-Capillary: 108 mg/dL — ABNORMAL HIGH (ref 70–99)
Glucose-Capillary: 96 mg/dL (ref 70–99)
Glucose-Capillary: 94 mg/dL (ref 70–99)
Glucose-Capillary: 98 mg/dL (ref 70–99)

## 2020-06-07 LAB — CULTURE, OB URINE: Culture: NO GROWTH

## 2020-06-07 LAB — RPR: RPR Ser Ql: NONREACTIVE

## 2020-06-07 LAB — PROTEIN / CREATININE RATIO, URINE
Creatinine, Urine: 132.32 mg/dL
Protein Creatinine Ratio: 0.1 mg/mg{Cre} (ref 0.00–0.15)
Total Protein, Urine: 13 mg/dL

## 2020-06-07 MED ORDER — EPHEDRINE 5 MG/ML INJ: 10.0000 mg | INTRAVENOUS | Status: DC | PRN

## 2020-06-07 MED ORDER — PHENYLEPHRINE 40 MCG/ML (10ML) SYRINGE FOR IV PUSH (FOR BLOOD PRESSURE SUPPORT)
80.0000 ug | PREFILLED_SYRINGE | INTRAVENOUS | Status: DC | PRN
  Filled 2020-06-07: qty 10

## 2020-06-07 MED ORDER — OXYTOCIN-SODIUM CHLORIDE 30-0.9 UT/500ML-% IV SOLN
1.0000 m[IU]/min | INTRAVENOUS | Status: DC
  Administered 2020-06-07: 2 m[IU]/min via INTRAVENOUS

## 2020-06-07 MED ORDER — SODIUM CHLORIDE (PF) 0.9 % IJ SOLN
INTRAMUSCULAR | Status: DC | PRN
Start: 2020-06-07 — End: 2020-06-07
  Administered 2020-06-07: 12 mL/h via EPIDURAL

## 2020-06-07 MED ORDER — LIDOCAINE HCL (PF) 1 % IJ SOLN: 30.0000 mL | INTRAMUSCULAR | Status: DC | PRN

## 2020-06-07 MED ORDER — FENTANYL CITRATE (PF) 100 MCG/2ML IJ SOLN
100.0000 ug | INTRAMUSCULAR | Status: DC | PRN
  Administered 2020-06-07 (×2): 100 ug via INTRAVENOUS
  Filled 2020-06-07 (×2): qty 2

## 2020-06-07 MED ORDER — LACTATED RINGERS IV SOLN: 500.0000 mL | INTRAVENOUS | Status: DC | PRN

## 2020-06-07 MED ORDER — LACTATED RINGERS IV SOLN: 500.0000 mL | Freq: Once | INTRAVENOUS | Status: DC

## 2020-06-07 MED ORDER — DIPHENHYDRAMINE HCL 50 MG/ML IJ SOLN: 12.5000 mg | INTRAMUSCULAR | Status: DC | PRN

## 2020-06-07 MED ORDER — OXYTOCIN-SODIUM CHLORIDE 30-0.9 UT/500ML-% IV SOLN
2.5000 [IU]/h | INTRAVENOUS | Status: DC
  Filled 2020-06-07: qty 500

## 2020-06-07 MED ORDER — MISOPROSTOL 50MCG HALF TABLET
50.0000 ug | ORAL_TABLET | ORAL | Status: DC | PRN
  Administered 2020-06-07: 50 ug via BUCCAL
  Filled 2020-06-07: qty 1

## 2020-06-07 MED ORDER — ONDANSETRON HCL 4 MG/2ML IJ SOLN: 4.0000 mg | Freq: Four times a day (QID) | INTRAMUSCULAR | Status: DC | PRN

## 2020-06-07 MED ORDER — LACTATED RINGERS IV SOLN: INTRAVENOUS | Status: DC

## 2020-06-07 MED ORDER — FENTANYL-BUPIVACAINE-NACL 0.5-0.125-0.9 MG/250ML-% EP SOLN
12.0000 mL/h | EPIDURAL | Status: DC | PRN
  Filled 2020-06-07: qty 250

## 2020-06-07 MED ORDER — PHENYLEPHRINE 40 MCG/ML (10ML) SYRINGE FOR IV PUSH (FOR BLOOD PRESSURE SUPPORT): 80.0000 ug | PREFILLED_SYRINGE | INTRAVENOUS | Status: DC | PRN

## 2020-06-07 MED ORDER — SOD CITRATE-CITRIC ACID 500-334 MG/5ML PO SOLN: 30.0000 mL | ORAL | Status: DC | PRN

## 2020-06-07 MED ORDER — TERBUTALINE SULFATE 1 MG/ML IJ SOLN: 0.2500 mg | Freq: Once | INTRAMUSCULAR | Status: DC | PRN

## 2020-06-07 MED ORDER — ACETAMINOPHEN 325 MG PO TABS
650.0000 mg | ORAL_TABLET | ORAL | Status: DC | PRN
  Administered 2020-06-07: 650 mg via ORAL
  Filled 2020-06-07: qty 2

## 2020-06-07 MED ORDER — LIDOCAINE HCL (PF) 1 % IJ SOLN
INTRAMUSCULAR | Status: DC | PRN
  Administered 2020-06-07: 6 mL via EPIDURAL

## 2020-06-07 MED ORDER — FENTANYL-BUPIVACAINE-NACL 0.5-0.125-0.9 MG/250ML-% EP SOLN: 12.0000 mL/h | EPIDURAL | Status: DC | PRN

## 2020-06-07 MED ORDER — OXYTOCIN BOLUS FROM INFUSION
333.0000 mL | Freq: Once | INTRAVENOUS | Status: AC
  Administered 2020-06-07: 333 mL via INTRAVENOUS

## 2020-06-07 NOTE — Anesthesia Preprocedure Evaluation (Signed)
Anesthesia Evaluation  Patient identified by MRN, date of birth, ID band Patient awake    Reviewed: Allergy & Precautions, H&P , NPO status , Patient's Chart, lab work & pertinent test results, reviewed documented beta blocker date and time   Airway Mallampati: I  TM Distance: >3 FB Neck ROM: full    Dental no notable dental hx. (+) Teeth Intact, Dental Advisory Given   Pulmonary asthma ,    Pulmonary exam normal breath sounds clear to auscultation       Cardiovascular hypertension, Pt. on medications Normal cardiovascular exam Rhythm:regular Rate:Normal     Neuro/Psych negative neurological ROS  negative psych ROS   GI/Hepatic negative GI ROS, Neg liver ROS,   Endo/Other  diabetes, Gestational, Oral Hypoglycemic Agents  Renal/GU negative Renal ROS  negative genitourinary   Musculoskeletal   Abdominal   Peds  Hematology negative hematology ROS (+)   Anesthesia Other Findings   Reproductive/Obstetrics (+) Pregnancy                             Anesthesia Physical Anesthesia Plan  ASA: III  Anesthesia Plan: Epidural   Post-op Pain Management:    Induction:   PONV Risk Score and Plan:   Airway Management Planned:   Additional Equipment:   Intra-op Plan:   Post-operative Plan:   Informed Consent: I have reviewed the patients History and Physical, chart, labs and discussed the procedure including the risks, benefits and alternatives for the proposed anesthesia with the patient or authorized representative who has indicated his/her understanding and acceptance.     Dental Advisory Given  Plan Discussed with: Anesthesiologist  Anesthesia Plan Comments: (Labs checked- platelets confirmed with RN in room. Fetal heart tracing, per RN, reported to be stable enough for sitting procedure. Discussed epidural, and patient consents to the procedure:  included risk of possible  headache,backache, failed block, allergic reaction, and nerve injury. This patient was asked if she had any questions or concerns before the procedure started.)        Anesthesia Quick Evaluation

## 2020-06-07 NOTE — Progress Notes (Signed)
LABOR PROGRESS NOTE  Rachel Neal is a 29 y.o. G2P0010 at [redacted]w[redacted]d  admitted for IOL for A2GDM on Metformin and GHTN.   Subjective: Patient doing well, starting to feel more pressure.  Objective: BP 134/78    Pulse 80    Temp 98.5 F (36.9 C) (Oral)    Resp 17    Ht 5\' 8"  (1.727 m)    Wt 104 kg    LMP 09/21/2019    SpO2 100%    BMI 34.85 kg/m  or  Vitals:   06/07/20 1931 06/07/20 2001 06/07/20 2013 06/07/20 2031  BP: 121/67 121/63  134/78  Pulse: 69 76  80  Resp: 17  16 17   Temp:   98.5 F (36.9 C)   TempSrc:   Oral   SpO2:      Weight:      Height:        AROM @1610   Dilation: Lip/rim Effacement (%): 100 Station: 0 Presentation: Vertex Exam by:: Dr. : baseline rate 13, moderate varibility, +accel, intermittent early decels with contractions Toco: q1-3 minutes  Labs: Lab Results  Component Value Date   WBC 6.6 06/07/2020   HGB 12.3 06/07/2020   HCT 38.2 06/07/2020   MCV 89.9 06/07/2020   PLT 183 06/07/2020    Patient Active Problem List   Diagnosis Date Noted   Gestational hypertension 06/07/2020   Round ligament pain 04/17/2020   Gestational diabetes mellitus (GDM) in third trimester controlled on oral hypoglycemic drug 04/04/2020   Elevated BP without diagnosis of hypertension 03/07/2020   Supervision of other normal pregnancy, antepartum 12/14/2019   Asthma affecting pregnancy, antepartum 12/14/2019    Assessment / Plan: 29 y.o. G2P0010 at [redacted]w[redacted]d here for IOL for A2GDM and GHTN   Labor: patient progressing well, s/p cytotec, FB, AROM @1611 . Currently on pitocin, progressing well. Continue augmentation as appropriate. Fetal Wellbeing:  Cat I  Pain Control:  Epidural Anticipated MOD:  SVD #A2GDM: GBG q2 hours, last was 98. #GHTN: stable at this time, most recent 134/78. Asymptomatic. PreE labs unremarkable.  02/13/2020, MD 06/07/2020, 8:58 PM

## 2020-06-07 NOTE — Discharge Summary (Signed)
Postpartum Discharge Summary     Patient Name: Rachel Neal DOB: Apr 30, 1991 MRN: [id number]  Date of admission: 06/07/2020 Delivery date:06/07/2020  Delivering provider: Arrie Senate  Date of discharge: 06/09/2020  Admitting diagnosis: Gestational hypertension [O13.9] Intrauterine pregnancy: [redacted]w[redacted]d    Secondary diagnosis:  Active Problems:   Supervision of other normal pregnancy, antepartum   Asthma affecting pregnancy, antepartum   Gestational diabetes mellitus (GDM) in third trimester controlled on oral hypoglycemic drug   Gestational hypertension   Vaginal delivery   Shoulder dystocia during labor and delivery  Additional problems: none    Discharge diagnosis: Term Pregnancy Delivered, Gestational Hypertension and GDM A2                                              Post partum procedures:none Augmentation: AROM, Pitocin, Cytotec and IP Foley Complications: None  Hospital course: Induction of Labor With Vaginal Delivery   29y.o. yo G2P0010 at 380w1das admitted to the hospital 06/07/2020 for induction of labor.  Indication for induction: Gestational hypertension and A2 DM.  Patient had an uncomplicated labor course with augmentation as noted above as follows: Membrane Rupture Time/Date: 4:12 PM ,06/07/2020   Delivery Method:Vaginal, Spontaneous  Episiotomy: None  Lacerations:  1st degree;Labial  Details of delivery can be found in separate delivery note.  Patient had a routine postpartum course. Patient is discharged home 06/09/20.  Newborn Data: Birth date:06/07/2020  Birth time:10:40 PM  Gender:Female  Living status:Living  Apgars:7 ,9  Weight:3306 g   Magnesium Sulfate received: No BMZ received: No Rhophylac:N/A MMR:N/A T-DaP:Given prenatally Flu: N/A Transfusion:No  Physical exam  Vitals:   06/08/20 0915 06/08/20 0956 06/08/20 1545 06/08/20 2050  BP: (!) 143/77 (!) 143/77 131/89 139/78  Pulse: 77  84 85  Resp: _0 Temp: 98.6 F (37 C)   98.3 F (36.8 C) 98.5 F (36.9 C)  TempSrc: Oral  Oral Oral  SpO2: 100%  100% 100%  Weight:      Height:       General: alert, cooperative and no distress Lochia: appropriate Uterine Fundus: firm Incision: N/A DVT Evaluation: No evidence of DVT seen on physical exam. Labs: Lab Results  Component Value Date   WBC 8.3 06/07/2020   HGB 12.9 06/07/2020   HCT 38.7 06/07/2020   MCV 90.0 06/07/2020   PLT 170 06/07/2020   CMP Latest Ref Rng & Units 06/07/2020  Glucose 70 - 99 mg/dL 178(H)  BUN 6 - 20 mg/dL 5(L)  Creatinine 0.44 - 1.00 mg/dL 0.78  Sodium 135 - 145 mmol/L 133(L)  Potassium 3.5 - 5.1 mmol/L 3.7  Chloride 98 - 111 mmol/L 104  CO2 22 - 32 mmol/L 18(L)  Calcium 8.9 - 10.3 mg/dL 9.1  Total Protein 6.5 - 8.1 g/dL 6.9  Total Bilirubin 0.3 - 1.2 mg/dL 0.3  Alkaline Phos 38 - 126 U/L 113  AST 15 - 41 U/L 19  ALT 0 - 44 U/L 17   Edinburgh Score: Edinburgh Postnatal Depression Scale Screening Tool 06/08/2020  I have been able to laugh and see the funny side of things. 0  I have looked forward with enjoyment to things. 0  I have blamed myself unnecessarily when things went wrong. 0  I have been anxious or worried for no good reason. 0  I have felt scared or  panicky for no good reason. 0  Things have been getting on top of me. 0  I have been so unhappy that I have had difficulty sleeping. 0  I have felt sad or miserable. 0  I have been so unhappy that I have been crying. 0  The thought of harming myself has occurred to me. 0  Edinburgh Postnatal Depression Scale Total 0     After visit meds:  Allergies as of 06/09/2020   No Known Allergies     Medication List    STOP taking these medications   Accu-Chek Guide test strip Generic drug: glucose blood   Accu-Chek Softclix Lancets lancets   metFORMIN 500 MG tablet Commonly known as: GLUCOPHAGE     TAKE these medications   acetaminophen 500 MG tablet Commonly known as: TYLENOL Take 2 tablets (1,000 mg total)  by mouth every 6 (six) hours as needed (for pain scale < 4).   albuterol 108 (90 Base) MCG/ACT inhaler Commonly known as: VENTOLIN HFA Inhale 2 puffs into the lungs every 6 (six) hours as needed for wheezing.   amLODipine 5 MG tablet Commonly known as: NORVASC Take 1 tablet (5 mg total) by mouth daily.   ibuprofen 600 MG tablet Commonly known as: ADVIL Take 1 tablet (600 mg total) by mouth every 6 (six) hours.        Discharge home in stable condition Infant Feeding: Bottle and Breast Infant Disposition:home with mother Discharge instruction: per After Visit Summary and Postpartum booklet. Activity: Advance as tolerated. Pelvic rest for 6 weeks.  Diet: routine diet Future Appointments:No future appointments. Follow up Visit:   Please schedule this patient for a In person postpartum visit in 4 weeks with the following provider: Any provider. Additional Postpartum F/U:2 hour GTT and BP check 2-3 days  High risk pregnancy complicated by: GDM and HTN Delivery mode:  Vaginal, Spontaneous  Anticipated Birth Control:  Condoms   04/07/349 Arrie Senate, MD

## 2020-06-07 NOTE — Anesthesia Procedure Notes (Signed)
Epidural Patient location during procedure: OB Start time: 06/07/2020 2:45 PM End time: 06/07/2020 2:50 PM  Staffing Anesthesiologist: Bethena Midget, MD  Preanesthetic Checklist Completed: patient identified, IV checked, site marked, risks and benefits discussed, surgical consent, monitors and equipment checked, pre-op evaluation and timeout performed  Epidural Patient position: sitting Prep: DuraPrep and site prepped and draped Patient monitoring: continuous pulse ox and blood pressure Approach: midline Location: L3-L4 Injection technique: LOR air  Needle:  Needle type: Tuohy  Needle gauge: 17 G Needle length: 9 cm and 9 Needle insertion depth: 6 cm Catheter type: closed end flexible Catheter size: 19 Gauge Catheter at skin depth: 11 cm Test dose: negative  Assessment Events: blood not aspirated, injection not painful, no injection resistance, no paresthesia and negative IV test

## 2020-06-07 NOTE — Progress Notes (Signed)
LABOR PROGRESS NOTE  Rachel Neal is a 29 y.o. G2P0010 at [redacted]w[redacted]d  admitted for IOL for A2GDM on Metformin and GHTN.   Subjective: Patient doing well, comfortable with epidural at this time.   Objective: BP 125/66   Pulse 76   Temp 97.7 F (36.5 C) (Oral)   Resp 16   Ht 5\' 8"  (1.727 m)   Wt 104 kg   LMP 09/21/2019   SpO2 100%   BMI 34.85 kg/m  or  Vitals:   06/07/20 1531 06/07/20 1601 06/07/20 1618 06/07/20 1631  BP: 130/83 130/75  125/66  Pulse: 86 93  76  Resp:   17 16  Temp:   97.7 F (36.5 C)   TempSrc:   Oral   SpO2:      Weight:      Height:        AROM @1610   Dilation: 6 Effacement (%): 70 Station: -2 Presentation: Vertex Exam by:: CNM FHT: baseline rate 135, moderate varibility, +accel, no decel Toco: 2 minutes, moderate by palpation   Labs: Lab Results  Component Value Date   WBC 6.6 06/07/2020   HGB 12.3 06/07/2020   HCT 38.2 06/07/2020   MCV 89.9 06/07/2020   PLT 183 06/07/2020    Patient Active Problem List   Diagnosis Date Noted  . Gestational hypertension 06/07/2020  . Round ligament pain 04/17/2020  . Gestational diabetes mellitus (GDM) in third trimester controlled on oral hypoglycemic drug 04/04/2020  . Elevated BP without diagnosis of hypertension 03/07/2020  . Supervision of other normal pregnancy, antepartum 12/14/2019  . Asthma affecting pregnancy, antepartum 12/14/2019    Assessment / Plan: 29 y.o. G2P0010 at [redacted]w[redacted]d here for IOL for A2GDM and GHTN   Labor: patient progressing well, AROM performed, plan to reassess for pitocin need in 2-3 hours  Fetal Wellbeing:  Cat I  Pain Control:  Epidural Anticipated MOD:  SVD #GDM: continue to monitor CBGs, every 2 hours now that patient is 6cm. Most recent CBG 96. Prior to that 108 and 112. #GHTN: stable at this time, highest 143/89 and 139/92   30, CNM 06/07/2020, 4:39 PM

## 2020-06-07 NOTE — H&P (Signed)
LABOR AND DELIVERY ADMISSION HISTORY AND PHYSICAL NOTE  Rachel Neal is a 29 y.o. female G2P0010 with IUP at [redacted]w[redacted]d by LMP presenting for IOL for A2GDM on Metformin and Gestational hypertension. She reports positive fetal movement. She denies leakage of fluid or vaginal bleeding.  Prenatal History/Complications: PNC at [facility] Pregnancy complications:  - Gestational hypertension - Gestational diabetes mellitus on Metformin  - Asthma affecting pregnancy   Past Medical History: Past Medical History:  Diagnosis Date  . Asthma   . Gestational diabetes   . Hx of chlamydia infection   . Hypertension   . Low lying placenta, antepartum 02/02/2020  . Pregnancy induced hypertension     Past Surgical History: Past Surgical History:  Procedure Laterality Date  . JOINT REPLACEMENT    . KNEE SURGERY Left     Obstetrical History: OB History    Gravida  2   Para      Term      Preterm      AB  1   Living        SAB      TAB  1   Ectopic      Multiple      Live Births              Social History: Social History   Socioeconomic History  . Marital status: Single    Spouse name: Not on file  . Number of children: 0  . Years of education: 57  . Highest education level: Some college, no degree  Occupational History  . Occupation: Conservation officer, nature  . Occupation: Consulting civil engineer  Tobacco Use  . Smoking status: Never Smoker  . Smokeless tobacco: Never Used  Vaping Use  . Vaping Use: Never used  Substance and Sexual Activity  . Alcohol use: Yes    Alcohol/week: 2.0 standard drinks    Types: 2 Glasses of wine per week    Comment: occ  . Drug use: Never  . Sexual activity: Not Currently    Birth control/protection: None  Other Topics Concern  . Not on file  Social History Narrative  . Not on file   Social Determinants of Health   Financial Resource Strain:   . Difficulty of Paying Living Expenses: Not on file  Food Insecurity:   . Worried About Programme researcher, broadcasting/film/video in  the Last Year: Not on file  . Ran Out of Food in the Last Year: Not on file  Transportation Needs:   . Lack of Transportation (Medical): Not on file  . Lack of Transportation (Non-Medical): Not on file  Physical Activity:   . Days of Exercise per Week: Not on file  . Minutes of Exercise per Session: Not on file  Stress:   . Feeling of Stress : Not on file  Social Connections:   . Frequency of Communication with Friends and Family: Not on file  . Frequency of Social Gatherings with Friends and Family: Not on file  . Attends Religious Services: Not on file  . Active Member of Clubs or Organizations: Not on file  . Attends Banker Meetings: Not on file  . Marital Status: Not on file    Family History: Family History  Problem Relation Age of Onset  . High blood pressure Brother   . High blood pressure Sister   . Heart attack Father   . Asthma Father   . Diabetes Mother   . Diabetes Brother   . Breast cancer Maternal Aunt   .  Colon polyps Sister   . Bone cancer Maternal Aunt     Allergies: No Known Allergies  Medications Prior to Admission  Medication Sig Dispense Refill Last Dose  . Accu-Chek Softclix Lancets lancets Use as instructed 100 each 12   . albuterol (VENTOLIN HFA) 108 (90 Base) MCG/ACT inhaler Inhale 2 puffs into the lungs every 6 (six) hours as needed for wheezing. 8 g 0   . glucose blood (ACCU-CHEK GUIDE) test strip Use to check blood sugars four times a day was instructed 100 each 12   . metFORMIN (GLUCOPHAGE) 500 MG tablet Take 1 tablet (500 mg total) by mouth daily with supper. (Patient taking differently: Take 500 mg by mouth 2 (two) times daily with a meal. ) 60 tablet 5      Review of Systems  All systems reviewed and negative except as stated in HPI  Physical Exam Blood pressure (!) 144/88, pulse 96, temperature 98.7 F (37.1 C), temperature source Oral, resp. rate 17, height 5\' 8"  (1.727 m), weight 104 kg, last menstrual period  09/21/2019. General appearance: alert, cooperative and no distress Lungs: clear to auscultation bilaterally Heart: regular rate and rhythm Abdomen: soft, non-tender; bowel sounds normal Extremities: No calf swelling or tenderness Presentation: cephalic Fetal monitoring: 150/moderate/+accels/no decelerations  Uterine activity: occasional mild contractions  Dilation: 1 Effacement (%): Thick Station: -3 Exam by:: [email or web address] CNM  Prenatal labs: ABO, Rh: --/--/A POS (08/27 0900) Antibody: NEG (08/27 0900) Rubella: 5.18 (03/04 1151) RPR: NON REACTIVE (08/27 0900)  HBsAg: Negative (03/04 1151)  HIV: Non Reactive (06/23 08-21-1971)  GC/Chlamydia: negative (08/18) GBS: Negative/-- (08/18 0240)  2 hr Glucola: 121-220-226 Genetic screening:  Low risk  Anatomy 12-30-1970: normal female, low lying placenta    Nursing Staff Provider  Office Location  [facility] Dating  LMP  Language  English Anatomy US   low lying placenta, follow up in 5 weeks - resolved   Flu Vaccine  Declined 12/14/19 Genetic Screen  NIPS: low risk female  AFP:     TDaP vaccine  04/03/2020 Hgb A1C or  GTT Early A1c: 5.5 Third trimester GDM Glucose, Fasting 65 - 91 mg/dL 04/05/2020   Glucose, 1 hour 65 - 179 mg/dL [id number]   Glucose, 2 hour 65 - 152 mg/dL [id number]    Rhogam  n/a   LAB RESULTS   Feeding Plan Breast/Bottle Blood Type A/Positive/-- (03/04 1151)   Contraception Unsure, info given Antibody Negative (03/04 1151)  Circumcision YES Rubella 5.18 (03/04 1151)  Pediatrician   [facility]  RPR Non Reactive (03/04 1151)   Support Person FOB/SISTER HBsAg Negative (03/04 1151)   Prenatal Classes  HIV Non Reactive (03/04 1151)  BTL Consent  GBS  (For PCN allergy, check sensitivities)   VBAC Consent  Pap  12/14/19-NILM    Hgb Electro   neg [facility]  BP Cuff Ordered 12/14/19 CF  neg [facility]    SMA  neg [facility]    Waterbirth  [ ]  Class [ ]  Consent [ ]  CNM visit    Prenatal Transfer Tool  Maternal Diabetes: Yes:  Diabetes  Type:  Insulin/Medication controlled Genetic Screening: Normal Maternal Ultrasounds/Referrals: Normal Fetal Ultrasounds or other Referrals:  None Maternal Substance Abuse:  No Significant Maternal Medications:  Meds include: Other: Metformin Significant Maternal Lab Results: Group B Strep negative  Results for orders placed or performed during the hospital encounter of 06/07/20 (from the past 24 hour(s))  CBC   Collection Time: 06/07/20  9:00 AM  Result Value Ref  Range   WBC 6.6 4.0 - 10.5 K/uL   RBC 4.25 3.87 - 5.11 MIL/uL   Hemoglobin 12.3 12.0 - 15.0 g/dL   HCT 40.9 36 - 46 %   MCV 89.9 80.0 - 100.0 fL   MCH 28.9 26.0 - 34.0 pg   MCHC 32.2 30.0 - 36.0 g/dL   RDW 81.1 91.4 - 78.2 %   Platelets 183 150 - 400 K/uL   nRBC 0.0 0.0 - 0.2 %  RPR   Collection Time: 06/07/20  9:00 AM  Result Value Ref Range   RPR Ser Ql NON REACTIVE NON REACTIVE  Comprehensive metabolic panel   Collection Time: 06/07/20  9:00 AM  Result Value Ref Range   Sodium 133 (L) 135 - 145 mmol/L   Potassium 3.7 3.5 - 5.1 mmol/L   Chloride 104 98 - 111 mmol/L   CO2 18 (L) 22 - 32 mmol/L   Glucose, Bld 178 (H) 70 - 99 mg/dL   BUN 5 (L) 6 - 20 mg/dL   Creatinine, Ser 9.56 0.44 - 1.00 mg/dL   Calcium 9.1 8.9 - 21.3 mg/dL   Total Protein 6.9 6.5 - 8.1 g/dL   Albumin 2.7 (L) 3.5 - 5.0 g/dL   AST 19 15 - 41 U/L   ALT 17 0 - 44 U/L   Alkaline Phosphatase 113 38 - 126 U/L   Total Bilirubin 0.3 0.3 - 1.2 mg/dL   GFR calc non Af Amer >60 >60 mL/min   GFR calc Af Amer >60 >60 mL/min   Anion gap 11 5 - 15  Protein / creatinine ratio, urine   Collection Time: 06/07/20  9:00 AM  Result Value Ref Range   Creatinine, Urine 132.32 mg/dL   Total Protein, Urine 13 mg/dL   Protein Creatinine Ratio 0.10 0.00 - 0.15 mg/mg[Cre]  Type and screen   Collection Time: 06/07/20  9:00 AM  Result Value Ref Range   ABO/RH(D) A POS    Antibody Screen NEG    Sample Expiration      06/10/2020,2359 Performed at [facility], [address], [place], [place] [address]   Glucose, capillary   Collection Time: 06/07/20 10:04 AM  Result Value Ref Range   Glucose-Capillary 112 (H) 70 - 99 mg/dL  Results for orders placed or performed during the hospital encounter of 06/06/20 (from the past 24 hour(s))  Urinalysis, Routine w reflex microscopic Urine, Clean Catch   Collection Time: 06/06/20  2:56 PM  Result Value Ref Range   Color, Urine YELLOW YELLOW   APPearance HAZY (A) CLEAR   Specific Gravity, Urine 1.012 1.005 - 1.030   pH 6.0 5.0 - 8.0   Glucose, UA NEGATIVE NEGATIVE mg/dL   Hgb urine dipstick NEGATIVE NEGATIVE   Bilirubin Urine NEGATIVE NEGATIVE   Ketones, ur NEGATIVE NEGATIVE mg/dL   Protein, ur NEGATIVE NEGATIVE mg/dL   Nitrite NEGATIVE NEGATIVE   Leukocytes,Ua LARGE (A) NEGATIVE   RBC / HPF 0-5 0 - 5 RBC/hpf   WBC, UA 6-10 0 - 5 WBC/hpf   Bacteria, UA MANY (A) NONE SEEN   Squamous Epithelial / LPF 0-5 0 - 5   Mucus PRESENT   Protein / creatinine ratio, urine   Collection Time: 06/06/20  2:58 PM  Result Value Ref Range   Creatinine, Urine 100.66 mg/dL   Total Protein, Urine 11 mg/dL   Protein Creatinine Ratio 0.11 0.00 - 0.15 mg/mg[Cre]  CBC   Collection Time: 06/06/20  3:04 PM  Result  Value Ref Range   WBC 6.1 4.0 - 10.5 K/uL   RBC 4.47 3.87 - 5.11 MIL/uL   Hemoglobin 13.2 12.0 - 15.0 g/dL   HCT 81.139.5 36 - 46 %   MCV 88.4 80.0 - 100.0 fL   MCH 29.5 26.0 - 34.0 pg   MCHC 33.4 30.0 - 36.0 g/dL   RDW 91.413.6 78.211.5 - 95.615.5 %   Platelets 197 150 - 400 K/uL   nRBC 0.0 0.0 - 0.2 %  Comprehensive metabolic panel   Collection Time: 06/06/20  3:04 PM  Result Value Ref Range   Sodium 134 (L) 135 - 145 mmol/L   Potassium 3.8 3.5 - 5.1 mmol/L   Chloride 105 98 - 111 mmol/L   CO2 19 (L) 22 - 32 mmol/L   Glucose, Bld 118 (H) 70 - 99 mg/dL   BUN <5 (L) 6 - 20 mg/dL   Creatinine, Ser 2.130.80 0.44 - 1.00 mg/dL   Calcium 9.4 8.9 - 08.610.3 mg/dL   Total Protein 6.8 6.5 - 8.1 g/dL   Albumin 2.8 (L)  3.5 - 5.0 g/dL   AST 17 15 - 41 U/L   ALT 18 0 - 44 U/L   Alkaline Phosphatase 126 38 - 126 U/L   Total Bilirubin 0.4 0.3 - 1.2 mg/dL   GFR calc non Af Amer >60 >60 mL/min   GFR calc Af Amer >60 >60 mL/min   Anion gap 10 5 - 15  SARS Coronavirus 2 by RT PCR (hospital order, [facility]) Nasopharyngeal Nasopharyngeal Swab   Collection Time: 06/06/20  4:31 PM   Specimen: Nasopharyngeal Swab  Result Value Ref Range   SARS Coronavirus 2 NEGATIVE NEGATIVE    Patient Active Problem List   Diagnosis Date Noted  . Gestational hypertension 06/07/2020  . Round ligament pain 04/17/2020  . Gestational diabetes mellitus (GDM) in third trimester controlled on oral hypoglycemic drug 04/04/2020  . Elevated BP without diagnosis of hypertension 03/07/2020  . Supervision of other normal pregnancy, antepartum 12/14/2019  . Asthma affecting pregnancy, antepartum 12/14/2019    Assessment: Rachel Neal is a 29 y.o. G2P0010 at 7752w1d here for IOL for A2GDM and GHTN   #Labor: FB inserted @ 0940. Cytotec buccal ordered.  #Pain: PRN medication  #FWB: Cat I  #ID:  GBS neg  #MOF: Breast and formula  #MOC:Condoms    Sharyon CableRogers, Seena Face C, CNM 06/07/2020, 12:10 PM

## 2020-06-08 ENCOUNTER — Encounter (HOSPITAL_COMMUNITY): Payer: Self-pay | Admitting: Obstetrics & Gynecology

## 2020-06-08 LAB — CBC
HCT: 38.7 % (ref 36.0–46.0)
Hemoglobin: 12.9 g/dL (ref 12.0–15.0)
MCH: 30 pg (ref 26.0–34.0)
MCHC: 33.3 g/dL (ref 30.0–36.0)
MCV: 90 fL (ref 80.0–100.0)
Platelets: 170 10*3/uL (ref 150–400)
RBC: 4.3 MIL/uL (ref 3.87–5.11)
RDW: 13.8 % (ref 11.5–15.5)
WBC: 8.3 10*3/uL (ref 4.0–10.5)
nRBC: 0 % (ref 0.0–0.2)

## 2020-06-08 LAB — GLUCOSE, CAPILLARY: Glucose-Capillary: 140 mg/dL — ABNORMAL HIGH (ref 70–99)

## 2020-06-08 MED ORDER — ONDANSETRON HCL 4 MG PO TABS: 4.0000 mg | ORAL_TABLET | ORAL | Status: DC | PRN

## 2020-06-08 MED ORDER — DIPHENHYDRAMINE HCL 25 MG PO CAPS: 25.0000 mg | ORAL_CAPSULE | Freq: Four times a day (QID) | ORAL | Status: DC | PRN

## 2020-06-08 MED ORDER — ACETAMINOPHEN 325 MG PO TABS: 650.0000 mg | ORAL_TABLET | ORAL | Status: DC | PRN

## 2020-06-08 MED ORDER — COCONUT OIL OIL: 1.0000 "application " | TOPICAL_OIL | Status: DC | PRN

## 2020-06-08 MED ORDER — ZOLPIDEM TARTRATE 5 MG PO TABS: 5.0000 mg | ORAL_TABLET | Freq: Every evening | ORAL | Status: DC | PRN

## 2020-06-08 MED ORDER — SIMETHICONE 80 MG PO CHEW: 80.0000 mg | CHEWABLE_TABLET | ORAL | Status: DC | PRN

## 2020-06-08 MED ORDER — AMLODIPINE BESYLATE 5 MG PO TABS
5.0000 mg | ORAL_TABLET | Freq: Every day | ORAL | Status: DC
  Administered 2020-06-08 – 2020-06-09 (×2): 5 mg via ORAL
  Filled 2020-06-08 (×2): qty 1

## 2020-06-08 MED ORDER — SENNOSIDES-DOCUSATE SODIUM 8.6-50 MG PO TABS
2.0000 | ORAL_TABLET | ORAL | Status: DC
  Filled 2020-06-08: qty 2

## 2020-06-08 MED ORDER — BENZOCAINE-MENTHOL 20-0.5 % EX AERO
1.0000 "application " | INHALATION_SPRAY | CUTANEOUS | Status: DC | PRN
  Administered 2020-06-08: 1 via TOPICAL
  Filled 2020-06-08: qty 56

## 2020-06-08 MED ORDER — ONDANSETRON HCL 4 MG/2ML IJ SOLN: 4.0000 mg | INTRAMUSCULAR | Status: DC | PRN

## 2020-06-08 MED ORDER — IBUPROFEN 600 MG PO TABS
600.0000 mg | ORAL_TABLET | Freq: Four times a day (QID) | ORAL | Status: DC
  Administered 2020-06-08 – 2020-06-09 (×5): 600 mg via ORAL
  Filled 2020-06-08 (×5): qty 1

## 2020-06-08 MED ORDER — DIBUCAINE (PERIANAL) 1 % EX OINT: 1.0000 "application " | TOPICAL_OINTMENT | CUTANEOUS | Status: DC | PRN

## 2020-06-08 MED ORDER — WITCH HAZEL-GLYCERIN EX PADS: 1.0000 "application " | MEDICATED_PAD | CUTANEOUS | Status: DC | PRN

## 2020-06-08 MED ORDER — PRENATAL MULTIVITAMIN CH
1.0000 | ORAL_TABLET | Freq: Every day | ORAL | Status: DC
  Administered 2020-06-08: 1 via ORAL
  Filled 2020-06-08: qty 1

## 2020-06-08 MED ORDER — TETANUS-DIPHTH-ACELL PERTUSSIS 5-2.5-18.5 LF-MCG/0.5 IM SUSP: 0.5000 mL | Freq: Once | INTRAMUSCULAR | Status: DC

## 2020-06-08 NOTE — Lactation Note (Signed)
This note was copied from a baby's chart. Lactation Consultation Note  Patient Name: Rachel Neal DCVUD'T Date: 06/08/2020 Reason for consult: Follow-up assessment   [name] Rachel Neal now 20 hours old.  Mom breastfeeding on arrival.  Good breastfeeding observed.  Told mom she looked like and experienced breastfeeding mom. Some rythmic sucking and intermittent swallows observed. Mom reports she feels they have been doing well.  Rachel Neal started to get a little sleepy and LC attempted to keep her awake with some stimulation.   Then Rachel Neal came off the breast. But is rooting.   Asked mom of I could touch her and help mom said yes.  LC not able to get her back on after a few attempts and mom got a phone call and stated she will put her back on herself.  Praised breastfeeding.  Urged mom to call lactation as needed.    Media Pizzini Michaelle Copas 06/08/2020, 6:52 PM

## 2020-06-08 NOTE — Progress Notes (Signed)
Post Partum Day 1  Subjective: Doing well without concerns. Abdominal pain improved. Lochia appropriate. Ambulating without difficulty. Urinating and passing flatus without difficulty. Tolerating PO. Denies cp, sob, palpitations, vision changes, headaches.  Objective: Blood pressure (!) 144/78, pulse 76, temperature 99.4 F (37.4 C), temperature source Oral, resp. rate 18, height 5\' 8"  (1.727 m), weight 104 kg, last menstrual period 09/21/2019, SpO2 100 %, unknown if currently breastfeeding.  Physical Exam:  General: alert, cooperative and no distress Lochia: appropriate Uterine Fundus: firm Incision: n/a DVT Evaluation: No evidence of DVT seen on physical exam.  Recent Labs    06/07/20 0900 06/07/20 2352  HGB 12.3 12.9  HCT 38.2 38.7    Assessment/Plan:  PPD#1 s/p uncomplicated SVD, IOL A2GDM and gHTN.  -Doing well, meeting milestones  -breast and bottle feeding  -desires condoms for contraception  #gHTN: BP post partum has remained elevated, asymptomatic. Will initiate norvasc 5 mg daily.  #A2GDM: FBGL 140, collected shortly after soda/eating. Will check 2hr GTT at post partum appt.   Plan for discharge tomorrow.   LOS: 1 day   06/09/20 06/08/2020, 6:06 AM

## 2020-06-08 NOTE — Progress Notes (Signed)
Fasting blood glucose drawn this morning, it was 140. Pt did eat after delivery around 11 pm and had a diet ginger ale between 1-2 am. MD notified of result. Pt requesting to drink juice, "something other than water". MD aware and is okay with pt drinking/eating.

## 2020-06-08 NOTE — Progress Notes (Signed)
Prenatal counseling for impending newborn done--  Reviewed current vaccine policy and answered all questions.  1st child, Currently 36.5 weeks, Current complications:  GDM on metformin and monitoring diet, Prenatal care initiated:  4-5wks Z76.81

## 2020-06-08 NOTE — Anesthesia Postprocedure Evaluation (Signed)
Anesthesia Post Note  Patient: Rachel Neal  Procedure(s) Performed: AN AD HOC LABOR EPIDURAL     Patient location during evaluation: Mother Baby Anesthesia Type: Epidural Level of consciousness: awake and alert and oriented Pain management: satisfactory to patient Vital Signs Assessment: post-procedure vital signs reviewed and stable Respiratory status: spontaneous breathing and nonlabored ventilation Cardiovascular status: stable Postop Assessment: no headache, no backache, no signs of nausea or vomiting, adequate PO intake, patient able to bend at knees and able to ambulate (patient up walking) Anesthetic complications: no   No complications documented.  Last Vitals:  Vitals:   06/08/20 0200 06/08/20 0535  BP: (!) 156/96 (!) 144/78  Pulse: 83 76  Resp: 18 18  Temp: 36.9 C 37.4 C  SpO2: 100% 100%    Last Pain:  Vitals:   06/08/20 0535  TempSrc: Oral  PainSc: 4    Pain Goal: Patients Stated Pain Goal: 0 (06/07/20 1410)                 Madison Hickman

## 2020-06-08 NOTE — Lactation Note (Signed)
This note was copied from a baby's chart. Lactation Consultation Note  Patient Name: Rachel Neal [id number] Date: 06/08/2020 Reason for consult: Initial assessment;1st time breastfeeding;Maternal endocrine disorder;Early term 37-38.6wks Type of Endocrine Disorder?: Diabetes P1, 3 hour ETI female infant. Mom is active on the [facility] in [facility] and she has ordered a DEBP with her insurance. Per mom, infant did not latch well in L&D and had low blood sugar mom had GDM in her pregnancy. Mom latched infant on her right breast using the football hold, infant latched without difficulty, swallows observed and infant BF for 15 minutes. Infant was given 4 mls of colostrum by spoon.  Mom was doing STS with infant as LC left the room. Mom will continue to BF infant according to cues, 8 to 12+ times within 24 hours. Mom knows to ask RN or LC for BF assistance if needed. Mom made aware of O/P services, breastfeeding support groups, community resources, and our phone # for post-discharge questions.   Maternal Data Formula Feeding for Exclusion: Yes Reason for exclusion: Mother's choice to formula and breast feed on admission Has patient been taught Hand Expression?: Yes Does the patient have breastfeeding experience prior to this delivery?: No  Feeding Feeding Type: Breast Fed  LATCH Score Latch: Grasps breast easily, tongue down, lips flanged, rhythmical sucking.  Audible Swallowing: Spontaneous and intermittent  Type of Nipple: Everted at rest and after stimulation  Comfort (Breast/Nipple): Soft / non-tender  Hold (Positioning): Assistance needed to correctly position infant at breast and maintain latch.  LATCH Score: 9  Interventions Interventions: Breast feeding basics reviewed;Assisted with latch;Breast compression;Skin to skin;Adjust position;Breast massage;Support pillows;Hand express;Position options  Lactation Tools Discussed/Used WIC Program: Yes   Consult  Status Consult Status: Follow-up Date: 06/09/20 Follow-up type: In-patient    Danelle Earthly 06/08/2020, 2:31 AM

## 2020-06-09 MED ORDER — IBUPROFEN 600 MG PO TABS
600.0000 mg | ORAL_TABLET | Freq: Four times a day (QID) | ORAL | 0 refills | Status: DC
Start: 2020-06-09 — End: 2020-08-26

## 2020-06-09 MED ORDER — AMLODIPINE BESYLATE 5 MG PO TABS
5.0000 mg | ORAL_TABLET | Freq: Every day | ORAL | 0 refills | Status: DC
Start: 2020-06-09 — End: 2020-06-12

## 2020-06-09 MED ORDER — ACETAMINOPHEN 500 MG PO TABS
1000.0000 mg | ORAL_TABLET | Freq: Four times a day (QID) | ORAL | 0 refills | Status: DC | PRN
Start: 2020-06-09 — End: 2020-08-26

## 2020-06-09 NOTE — Discharge Instructions (Signed)
-stop metformin, we will recheck glucose tolerance test at 4 week appt -start amlodipine 5 mg daily for blood pressure, we will call you for a blood pressure check in 2-3 days -continue prenatal vitamins while breastfeeding  Postpartum Care After Vaginal Delivery This sheet gives you information about how to care for yourself from the time you deliver your baby to up to 6-12 weeks after delivery (postpartum period). Your health care provider may also give you more specific instructions. If you have problems or questions, contact your health care provider. Follow these instructions at home: Vaginal bleeding  It is normal to have vaginal bleeding (lochia) after delivery. Wear a sanitary pad for vaginal bleeding and discharge. ? During the first week after delivery, the amount and appearance of lochia is often similar to a menstrual period. ? Over the next few weeks, it will gradually decrease to a dry, yellow-brown discharge. ? For most women, lochia stops completely by 4-6 weeks after delivery. Vaginal bleeding can vary from woman to woman.  Change your sanitary pads frequently. Watch for any changes in your flow, such as: ? A sudden increase in volume. ? A change in color. ? Large blood clots.  If you pass a blood clot from your vagina, save it and call your health care provider to discuss. Do not flush blood clots down the toilet before talking with your health care provider.  Do not use tampons or douches until your health care provider says this is safe.  If you are not breastfeeding, your period should return 6-8 weeks after delivery. If you are feeding your child breast milk only (exclusive breastfeeding), your period may not return until you stop breastfeeding. Perineal care  Keep the area between the vagina and the anus (perineum) clean and dry as told by your health care provider. Use medicated pads and pain-relieving sprays and creams as directed.  If you had a cut in the  perineum (episiotomy) or a tear in the vagina, check the area for signs of infection until you are healed. Check for: ? More redness, swelling, or pain. ? Fluid or blood coming from the cut or tear. ? Warmth. ? Pus or a bad smell.  You may be given a squirt bottle to use instead of wiping to clean the perineum area after you go to the bathroom. As you start healing, you may use the squirt bottle before wiping yourself. Make sure to wipe gently.  To relieve pain caused by an episiotomy, a tear in the vagina, or swollen veins in the anus (hemorrhoids), try taking a warm sitz bath 2-3 times a day. A sitz bath is a warm water bath that is taken while you are sitting down. The water should only come up to your hips and should cover your buttocks. Breast care  Within the first few days after delivery, your breasts may feel heavy, full, and uncomfortable (breast engorgement). Milk may also leak from your breasts. Your health care provider can suggest ways to help relieve the discomfort. Breast engorgement should go away within a few days.  If you are breastfeeding: ? Wear a bra that supports your breasts and fits you well. ? Keep your nipples clean and dry. Apply creams and ointments as told by your health care provider. ? You may need to use breast pads to absorb milk that leaks from your breasts. ? You may have uterine contractions every time you breastfeed for up to several weeks after delivery. Uterine contractions help your uterus return  to its normal size. ? If you have any problems with breastfeeding, work with your health care provider or Advertising copywriter.  If you are not breastfeeding: ? Avoid touching your breasts a lot. Doing this can make your breasts produce more milk. ? Wear a good-fitting bra and use cold packs to help with swelling. ? Do not squeeze out (express) milk. This causes you to make more milk. Intimacy and sexuality  Ask your health care provider when you can engage  in sexual activity. This may depend on: ? Your risk of infection. ? How fast you are healing. ? Your comfort and desire to engage in sexual activity.  You are able to get pregnant after delivery, even if you have not had your period. If desired, talk with your health care provider about methods of birth control (contraception). Medicines  Take over-the-counter and prescription medicines only as told by your health care provider.  If you were prescribed an antibiotic medicine, take it as told by your health care provider. Do not stop taking the antibiotic even if you start to feel better. Activity  Gradually return to your normal activities as told by your health care provider. Ask your health care provider what activities are safe for you.  Rest as much as possible. Try to rest or take a nap while your baby is sleeping. Eating and drinking   Drink enough fluid to keep your urine pale yellow.  Eat high-fiber foods every day. These may help prevent or relieve constipation. High-fiber foods include: ? Whole grain cereals and breads. ? Brown rice. ? Beans. ? Fresh fruits and vegetables.  Do not try to lose weight quickly by cutting back on calories.  Take your prenatal vitamins until your postpartum checkup or until your health care provider tells you it is okay to stop. Lifestyle  Do not use any products that contain nicotine or tobacco, such as cigarettes and e-cigarettes. If you need help quitting, ask your health care provider.  Do not drink alcohol, especially if you are breastfeeding. General instructions  Keep all follow-up visits for you and your baby as told by your health care provider. Most women visit their health care provider for a postpartum checkup within the first 3-6 weeks after delivery. Contact a health care provider if:  You feel unable to cope with the changes that your child brings to your life, and these feelings do not go away.  You feel unusually sad or  worried.  Your breasts become red, painful, or hard.  You have a fever.  You have trouble holding urine or keeping urine from leaking.  You have little or no interest in activities you used to enjoy.  You have not breastfed at all and you have not had a menstrual period for 12 weeks after delivery.  You have stopped breastfeeding and you have not had a menstrual period for 12 weeks after you stopped breastfeeding.  You have questions about caring for yourself or your baby.  You pass a blood clot from your vagina. Get help right away if:  You have chest pain.  You have difficulty breathing.  You have sudden, severe leg pain.  You have severe pain or cramping in your lower abdomen.  You bleed from your vagina so much that you fill more than one sanitary pad in one hour. Bleeding should not be heavier than your heaviest period.  You develop a severe headache.  You faint.  You have blurred vision or spots in your  vision.  You have bad-smelling vaginal discharge.  You have thoughts about hurting yourself or your baby. If you ever feel like you may hurt yourself or others, or have thoughts about taking your own life, get help right away. You can go to the nearest emergency department or call:  Your local emergency services (911 in the [place]  A suicide crisis helpline, such as the National Suicide Prevention Lifeline at 507-837-9794. This is open 24 hours a day. Summary  The period of time right after you deliver your newborn up to 6-12 weeks after delivery is called the postpartum period.  Gradually return to your normal activities as told by your health care provider.  Keep all follow-up visits for you and your baby as told by your health care provider. This information is not intended to replace advice given to you by your health care provider. Make sure you discuss any questions you have with your health care provider. Document Revised: 10/01/2017 Document Reviewed:  07/12/2017 Elsevier Patient Education  2020 [employer].

## 2020-06-09 NOTE — Lactation Note (Addendum)
This note was copied from a baby's chart. Lactation Consultation Note  Patient Name: Rachel Neal [id number] Date: 06/09/2020   I asked Wendall Papa, RN if Mom would be interested in seeing lactation prior to d/c (see previous note written by Serena Croissant, RN).   Per RN, Mom did not want to see lactation prior to d/c. Mom plans to do some more supplementing; RN provided some more Enfamil. RN states that Mom has a hand pump & that her DEBP will be arriving from insurance.   Mom noted to be on amlodipine 5 mg qd (L3, per Maisie Fus Hale's "Medications & Mother's Milk").  Lurline Hare [facility] 06/09/2020, 10:55 AM

## 2020-06-12 ENCOUNTER — Ambulatory Visit: Payer: Medicaid Other

## 2020-06-12 ENCOUNTER — Telehealth (INDEPENDENT_AMBULATORY_CARE_PROVIDER_SITE_OTHER): Payer: Medicaid Other

## 2020-06-12 VITALS — BP 153/93 | HR 82

## 2020-06-12 DIAGNOSIS — Z3042 Encounter for surveillance of injectable contraceptive: Secondary | ICD-10-CM

## 2020-06-12 MED ORDER — AMLODIPINE BESYLATE 5 MG PO TABS
10.0000 mg | ORAL_TABLET | Freq: Every day | ORAL | 0 refills | Status: DC
Start: 2020-06-12 — End: 2020-08-26

## 2020-06-12 NOTE — Progress Notes (Signed)
..  Subjective:  Rachel Neal is a 29 y.o. female doing a virtual BP check.   Hypertension ROS: taking medications as instructed, no medication side effects noted, no TIA's, no chest pain on exertion, no dyspnea on exertion and no swelling of ankles. Pt denies headaches, dizziness, and blurred vision.   Objective:  BP (!) 153/93   Pulse 82   LMP 09/21/2019   Appearance alert, well appearing, and in no distress. General exam BP noted to need improvement today during virtual visit.    Assessment:   Blood Pressure needs improvement.   Plan:  Orders and follow up as documented in patient record. Provider advised that pt increase amlodipine to 10 mg, pt agreed. PP visit is scheduled for 07-19-20.

## 2020-06-12 NOTE — Progress Notes (Signed)
I have reviewed this chart and agree with the RN/CMA assessment and management.    K. Meryl Karelyn Brisby, M.D. [facility] (Faculty Practice)   

## 2020-06-13 ENCOUNTER — Telehealth: Payer: Self-pay | Admitting: *Deleted

## 2020-06-13 NOTE — Telephone Encounter (Signed)
Pt called to office for Rx for breast pump. Pt advised to go through [employer] site, complete order process and paperwork/order will be sent to our office to complete.  Pt advise if she does not hear anything from [employer] regarding order to call back to office.

## 2020-06-19 ENCOUNTER — Ambulatory Visit: Payer: Medicaid Other

## 2020-07-19 ENCOUNTER — Ambulatory Visit: Payer: Medicaid Other | Admitting: Obstetrics and Gynecology

## 2020-07-19 ENCOUNTER — Other Ambulatory Visit: Payer: Medicaid Other

## 2020-08-26 ENCOUNTER — Other Ambulatory Visit: Payer: Self-pay

## 2020-08-26 ENCOUNTER — Encounter (HOSPITAL_COMMUNITY): Payer: Self-pay

## 2020-08-26 ENCOUNTER — Emergency Department (HOSPITAL_COMMUNITY)
Admission: EM | Admit: 2020-08-26 | Discharge: 2020-08-26 | Disposition: A | Discharge status: Home / Self Care | Payer: Medicaid Other | Attending: Emergency Medicine | Admitting: Emergency Medicine

## 2020-08-26 DIAGNOSIS — N739 Female pelvic inflammatory disease, unspecified: Secondary | ICD-10-CM | POA: Insufficient documentation

## 2020-08-26 DIAGNOSIS — N730 Acute parametritis and pelvic cellulitis: Secondary | ICD-10-CM

## 2020-08-26 DIAGNOSIS — I10 Essential (primary) hypertension: Secondary | ICD-10-CM | POA: Insufficient documentation

## 2020-08-26 DIAGNOSIS — J45909 Unspecified asthma, uncomplicated: Secondary | ICD-10-CM | POA: Insufficient documentation

## 2020-08-26 DIAGNOSIS — A64 Unspecified sexually transmitted disease: Secondary | ICD-10-CM | POA: Diagnosis present

## 2020-08-26 LAB — GC/CHLAMYDIA PROBE AMP (~~LOC~~) NOT AT ARMC
Chlamydia: NEGATIVE
Comment: NEGATIVE
Comment: NORMAL
Neisseria Gonorrhea: POSITIVE — AB

## 2020-08-26 LAB — URINALYSIS, ROUTINE W REFLEX MICROSCOPIC
Bilirubin Urine: NEGATIVE
Glucose, UA: NEGATIVE mg/dL
Hgb urine dipstick: NEGATIVE
Ketones, ur: NEGATIVE mg/dL
Nitrite: NEGATIVE
Protein, ur: 100 mg/dL — AB
Specific Gravity, Urine: 1.024 (ref 1.005–1.030)
WBC, UA: >50 WBC/hpf — ABNORMAL HIGH (ref 0–5)
pH: 6 (ref 5.0–8.0)

## 2020-08-26 LAB — WET PREP, GENITAL
Clue Cells Wet Prep HPF POC: NONE SEEN
Sperm: NONE SEEN
Trich, Wet Prep: NONE SEEN
Yeast Wet Prep HPF POC: NONE SEEN

## 2020-08-26 LAB — PREGNANCY, URINE: Preg Test, Ur: NEGATIVE

## 2020-08-26 MED ORDER — DOXYCYCLINE HYCLATE 100 MG PO TABS
100.0000 mg | ORAL_TABLET | Freq: Once | ORAL | Status: AC
  Administered 2020-08-26: 100 mg via ORAL
  Filled 2020-08-26: qty 1

## 2020-08-26 MED ORDER — DOXYCYCLINE HYCLATE 100 MG PO CAPS: 100.0000 mg | ORAL_CAPSULE | Freq: Two times a day (BID) | ORAL | 0 refills | Status: DC

## 2020-08-26 MED ORDER — LIDOCAINE HCL (PF) 1 % IJ SOLN
INTRAMUSCULAR | Status: AC
  Filled 2020-08-26: qty 30

## 2020-08-26 MED ORDER — CEFTRIAXONE SODIUM 1 G IJ SOLR
500.0000 mg | Freq: Once | INTRAMUSCULAR | Status: AC
  Administered 2020-08-26: 500 mg via INTRAMUSCULAR
  Filled 2020-08-26: qty 10

## 2020-08-26 NOTE — ED Triage Notes (Signed)
Patient arrived stating she is having lower abdominal pain over the last three days and yellow discharge. Reports concerned for an STD.

## 2020-08-26 NOTE — ED Provider Notes (Signed)
[facility]-EMERGENCY DEPT Provider Note: Lowella Dell, MD, FACEP  CSN: [id number] MRN: [id number] ARRIVAL: 08/26/20 at 0018 ROOM: WA15/WA15   CHIEF COMPLAINT  STD   HISTORY OF PRESENT ILLNESS  08/26/20 1:44 AM Rachel Neal is a 29 y.o. female with 3 days of suprapubic abdominal pain which she rates as a 7 out of 10.  It is worse with movement or palpation.  It has been associated with a yellow vaginal discharge and she is concerned she may have an STD.  She denies fever, chills, nausea, vomiting or diarrhea.  She got off her menses yesterday.   Past Medical History:  Diagnosis Date  . Asthma   . Gestational diabetes   . Hx of chlamydia infection   . Hypertension   . Low lying placenta, antepartum 02/02/2020  . Pregnancy induced hypertension     Past Surgical History:  Procedure Laterality Date  . JOINT REPLACEMENT    . KNEE SURGERY Left     Family History  Problem Relation Age of Onset  . High blood pressure Brother   . High blood pressure Sister   . Heart attack Father   . Asthma Father   . Diabetes Mother   . Diabetes Brother   . Breast cancer Maternal Aunt   . Colon polyps Sister   . Bone cancer Maternal Aunt     Social History   Tobacco Use  . Smoking status: Never Smoker  . Smokeless tobacco: Never Used  Vaping Use  . Vaping Use: Never used  Substance Use Topics  . Alcohol use: Yes    Alcohol/week: 2.0 standard drinks    Types: 2 Glasses of wine per week    Comment: occ  . Drug use: Never    Prior to Admission medications   Medication Sig Start Date End Date Taking? Authorizing Provider  albuterol (VENTOLIN HFA) 108 (90 Base) MCG/ACT inhaler Inhale 2 puffs into the lungs every 6 (six) hours as needed for wheezing. 12/14/19  Yes Sharyon Cable, CNM  doxycycline (VIBRAMYCIN) 100 MG capsule Take 1 capsule (100 mg total) by mouth 2 (two) times daily. One po bid x 7 days 08/26/20   Basha Krygier, MD  amLODipine (NORVASC) 5 MG tablet Take 2 tablets (10  mg total) by mouth daily. Patient not taking: Reported on 08/26/2020 06/12/20 08/26/20  Conan Bowens, MD    Allergies Patient has no known allergies.   REVIEW OF SYSTEMS  Negative except as noted here or in the History of Present Illness.   PHYSICAL EXAMINATION  Initial Vital Signs Blood pressure (!) 157/88, pulse 93, temperature 99.4 F (37.4 C), temperature source Oral, resp. rate 16, last menstrual period 08/21/2020, SpO2 100 %, unknown if currently breastfeeding.  Examination General: Well-developed, well-nourished female in no acute distress; appearance consistent with age of record HENT: normocephalic; atraumatic Eyes: pupils equal, round and reactive to light; extraocular muscles intact Neck: supple Heart: regular rate and rhythm Lungs: clear to auscultation bilaterally Abdomen: soft; nondistended; suprapubic tenderness; bowel sounds present GU: Urine grossly bloody; thick greenish-yellow vaginal discharge; no vaginal bleeding; cervical motion tenderness Extremities: No deformity; full range of motion; pulses normal Neurologic: Awake, alert and oriented; motor function intact in all extremities and symmetric; no facial droop Skin: Warm and dry Psychiatric: Normal mood and affect   RESULTS  Summary of this visit's results, reviewed and interpreted by myself:   EKG Interpretation  Date/Time:    Ventricular Rate:    PR Interval:    QRS  Duration:   QT Interval:    QTC Calculation:   R Axis:     Text Interpretation:        Laboratory Studies: Results for orders placed or performed during the hospital encounter of 08/26/20 (from the past 24 hour(s))  Urinalysis, Routine w reflex microscopic     Status: Abnormal   Collection Time: 08/26/20  1:44 AM  Result Value Ref Range   Color, Urine YELLOW YELLOW   APPearance HAZY (A) CLEAR   Specific Gravity, Urine 1.024 1.005 - 1.030   pH 6.0 5.0 - 8.0   Glucose, UA NEGATIVE NEGATIVE mg/dL   Hgb urine dipstick NEGATIVE  NEGATIVE   Bilirubin Urine NEGATIVE NEGATIVE   Ketones, ur NEGATIVE NEGATIVE mg/dL   Protein, ur 502 (A) NEGATIVE mg/dL   Nitrite NEGATIVE NEGATIVE   Leukocytes,Ua LARGE (A) NEGATIVE   RBC / HPF 0-5 0 - 5 RBC/hpf   WBC, UA >50 (H) 0 - 5 WBC/hpf   Bacteria, UA RARE (A) NONE SEEN   Squamous Epithelial / LPF 0-5 0 - 5   Mucus PRESENT   Pregnancy, urine     Status: None   Collection Time: 08/26/20  1:44 AM  Result Value Ref Range   Preg Test, Ur NEGATIVE NEGATIVE  Wet prep, genital     Status: Abnormal   Collection Time: 08/26/20  1:45 AM  Result Value Ref Range   Yeast Wet Prep HPF POC NONE SEEN NONE SEEN   Trich, Wet Prep NONE SEEN NONE SEEN   Clue Cells Wet Prep HPF POC NONE SEEN NONE SEEN   WBC, Wet Prep HPF POC MODERATE (A) NONE SEEN   Sperm NONE SEEN    Imaging Studies: No results found.  ED COURSE and MDM  Nursing notes, initial and subsequent vitals signs, including pulse oximetry, reviewed and interpreted by myself.  Vitals:   08/26/20 0033  BP: (!) 157/88  Pulse: 93  Resp: 16  Temp: 99.4 F (37.4 C)  TempSrc: Oral  SpO2: 100%   Medications  cefTRIAXone (ROCEPHIN) injection 500 mg (has no administration in time range)  doxycycline (VIBRA-TABS) tablet 100 mg (has no administration in time range)    2:43 AM Examination is consistent with cervicitis/PID given grossly purulent vaginal discharge with cervical motion tenderness.  The pyuria is likely due to vaginal contamination but urine sent for culture.  We will treat for PID (Rocephin 500 mg IM, doxycycline 100 mg twice daily for 14 days).  PROCEDURES  Procedures   ED DIAGNOSES     ICD-10-CM   1. PID (acute pelvic inflammatory disease)  N73.0        Leiloni Smithers, MD 08/26/20 [phone]

## 2020-08-27 LAB — URINE CULTURE: Culture: NO GROWTH

## 2020-08-27 NOTE — ED Notes (Signed)
Opened chart to provide work note as requested by pt.

## 2020-09-10 ENCOUNTER — Other Ambulatory Visit: Payer: Self-pay | Admitting: *Deleted

## 2020-09-10 NOTE — Telephone Encounter (Signed)
Received fax from [facility] requesting refill on amlodipine. Patient no showed for post partum visit 07/2020. No other appointments noted. Recently seen at ED for PID.  Clovis Pu, RN

## 2020-11-17 ENCOUNTER — Other Ambulatory Visit: Payer: Self-pay

## 2020-11-17 ENCOUNTER — Emergency Department (HOSPITAL_COMMUNITY)
Admission: EM | Admit: 2020-11-17 | Discharge: 2020-11-17 | Disposition: A | Discharge status: Home / Self Care | Payer: Medicaid Other | Attending: Emergency Medicine | Admitting: Emergency Medicine

## 2020-11-17 DIAGNOSIS — M545 Low back pain, unspecified: Secondary | ICD-10-CM | POA: Insufficient documentation

## 2020-11-17 DIAGNOSIS — J45909 Unspecified asthma, uncomplicated: Secondary | ICD-10-CM | POA: Insufficient documentation

## 2020-11-17 DIAGNOSIS — I10 Essential (primary) hypertension: Secondary | ICD-10-CM | POA: Diagnosis not present

## 2020-11-17 DIAGNOSIS — M791 Myalgia, unspecified site: Secondary | ICD-10-CM | POA: Insufficient documentation

## 2020-11-17 DIAGNOSIS — M7918 Myalgia, other site: Secondary | ICD-10-CM

## 2020-11-17 DIAGNOSIS — M79642 Pain in left hand: Secondary | ICD-10-CM | POA: Diagnosis not present

## 2020-11-17 DIAGNOSIS — Y92410 Unspecified street and highway as the place of occurrence of the external cause: Secondary | ICD-10-CM | POA: Diagnosis not present

## 2020-11-17 DIAGNOSIS — Z79899 Other long term (current) drug therapy: Secondary | ICD-10-CM | POA: Insufficient documentation

## 2020-11-17 DIAGNOSIS — Z9660 Presence of unspecified orthopedic joint implant: Secondary | ICD-10-CM | POA: Diagnosis not present

## 2020-11-17 DIAGNOSIS — M542 Cervicalgia: Secondary | ICD-10-CM | POA: Diagnosis present

## 2020-11-17 NOTE — Discharge Instructions (Addendum)
Please take Ibuprofen as needed for your pain. You can also apply ice to your neck, back, and finger to help with pain.   Follow up with your PCP regarding your ED visit today  Return to the ED for any worsening symptoms
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
## 2021-04-25 ENCOUNTER — Other Ambulatory Visit: Payer: Self-pay

## 2021-04-25 ENCOUNTER — Emergency Department (HOSPITAL_COMMUNITY)
Admission: EM | Admit: 2021-04-25 | Discharge: 2021-04-25 | Disposition: A | Discharge status: Home / Self Care | Payer: Medicaid Other | Attending: Emergency Medicine | Admitting: Emergency Medicine

## 2021-04-25 ENCOUNTER — Emergency Department (HOSPITAL_COMMUNITY): Payer: Medicaid Other

## 2021-04-25 ENCOUNTER — Encounter (HOSPITAL_COMMUNITY): Payer: Self-pay

## 2021-04-25 DIAGNOSIS — N939 Abnormal uterine and vaginal bleeding, unspecified: Secondary | ICD-10-CM | POA: Insufficient documentation

## 2021-04-25 DIAGNOSIS — Z96698 Presence of other orthopedic joint implants: Secondary | ICD-10-CM | POA: Insufficient documentation

## 2021-04-25 DIAGNOSIS — J45909 Unspecified asthma, uncomplicated: Secondary | ICD-10-CM | POA: Diagnosis not present

## 2021-04-25 DIAGNOSIS — R102 Pelvic and perineal pain: Secondary | ICD-10-CM | POA: Insufficient documentation

## 2021-04-25 LAB — CBC WITH DIFFERENTIAL/PLATELET
Abs Immature Granulocytes: 0.01 10*3/uL (ref 0.00–0.07)
Basophils Absolute: 0 10*3/uL (ref 0.0–0.1)
Basophils Relative: 0 %
Eosinophils Absolute: 0.1 10*3/uL (ref 0.0–0.5)
Eosinophils Relative: 1 %
HCT: 44.4 % (ref 36.0–46.0)
Hemoglobin: 14.7 g/dL (ref 12.0–15.0)
Immature Granulocytes: 0 %
Lymphocytes Relative: 32 %
Lymphs Abs: 2 10*3/uL (ref 0.7–4.0)
MCH: 30.1 pg (ref 26.0–34.0)
MCHC: 33.1 g/dL (ref 30.0–36.0)
MCV: 91 fL (ref 80.0–100.0)
Monocytes Absolute: 0.3 10*3/uL (ref 0.1–1.0)
Monocytes Relative: 5 %
Neutro Abs: 3.9 10*3/uL (ref 1.7–7.7)
Neutrophils Relative %: 62 %
Platelets: 318 10*3/uL (ref 150–400)
RBC: 4.88 MIL/uL (ref 3.87–5.11)
RDW: 12.8 % (ref 11.5–15.5)
WBC: 6.4 10*3/uL (ref 4.0–10.5)
nRBC: 0 % (ref 0.0–0.2)

## 2021-04-25 LAB — BASIC METABOLIC PANEL
Anion gap: 8 (ref 5–15)
BUN: 12 mg/dL (ref 6–20)
CO2: 22 mmol/L (ref 22–32)
Calcium: 9.1 mg/dL (ref 8.9–10.3)
Chloride: 105 mmol/L (ref 98–111)
Creatinine, Ser: 0.99 mg/dL (ref 0.44–1.00)
GFR, Estimated: >60 mL/min (ref >60)
Glucose, Bld: 111 mg/dL — ABNORMAL HIGH (ref 70–99)
Potassium: 4.6 mmol/L (ref 3.5–5.1)
Sodium: 135 mmol/L (ref 135–145)

## 2021-04-25 LAB — I-STAT BETA HCG BLOOD, ED (MC, WL, AP ONLY): I-stat hCG, quantitative: <5 m[IU]/mL (ref <5)

## 2021-04-25 NOTE — Discharge Instructions (Addendum)
Follow-up with the women's clinic.  You have been given their contact information and discharge report.  You can also try following up with a primary care doctor.   Blood work and ultrasound were all normal today. If your symptoms worsen you can always return to the emergency department.

## 2021-04-25 NOTE — ED Provider Notes (Signed)
[facility]-EMERGENCY DEPT Provider Note   CSN: [id number] Arrival date & time: 04/25/21  1447     History Chief Complaint  Patient presents with   Vaginal Bleeding    Rachel Neal is a 30 y.o. female with past medical history significant for asthma, traditional diabetes, hypertension, chlamydia presenting to emergency room today with chief complaint of irregular vaginal bleeding x 1 month. Patient states she does not have history of irregular cycle. She had STD testing x 2 weeks ago that was negative. She does admit to intermittent pelvic pain although denies any currently.  Patient states this visit of bleeding started x1 week ago and was initially very light.  She described it more as a spotting and was only using light tampons.  She states over the last 3 days the bleeding has become heavier and she is having to wear super tampons.  She changes it at least 4 times a day.  Patient is sexually active with 1 female partner, uses protection most of the the time. She admits to protected intercourse since STD testing.  She denies any fever, chills, palpitations, weakness, syncope, chest pain or shortness of breath, dizziness, back pain, vaginal discharge. Patient does admit to family history of fibroids. Patients admits to having hard time finding gyn in the area.    Past Medical History:  Diagnosis Date   Asthma    Gestational diabetes    Hx of chlamydia infection    Hypertension    Low lying placenta, antepartum 02/02/2020   Pregnancy induced hypertension     Patient Active Problem List   Diagnosis Date Noted   Gestational hypertension 06/07/2020   Vaginal delivery 06/07/2020   Shoulder dystocia during labor and delivery 06/07/2020   Gestational diabetes mellitus (GDM) in third trimester controlled on oral hypoglycemic drug 04/04/2020   Supervision of other normal pregnancy, antepartum 12/14/2019   Asthma affecting pregnancy, antepartum 12/14/2019    Past  Surgical History:  Procedure Laterality Date   JOINT REPLACEMENT     KNEE SURGERY Left      OB History     Gravida  2   Para  1   Term  1   Preterm      AB  1   Living  1      SAB      IAB  1   Ectopic      Multiple  0   Live Births  1           Family History  Problem Relation Age of Onset   Diabetes Mother    Heart attack Father    Asthma Father    High blood pressure Sister    Colon polyps Sister    High blood pressure Brother    Diabetes Brother    Breast cancer Maternal Aunt    Bone cancer Maternal Aunt     Social History   Tobacco Use   Smoking status: Never   Smokeless tobacco: Never  Vaping Use   Vaping Use: Never used  Substance Use Topics   Alcohol use: Yes    Alcohol/week: 2.0 standard drinks    Types: 2 Glasses of wine per week    Comment: occ   Drug use: Never    Home Medications Prior to Admission medications   Medication Sig Start Date End Date Taking? Authorizing Provider  albuterol (VENTOLIN HFA) 108 (90 Base) MCG/ACT inhaler Inhale 2 puffs into the lungs every 6 (six) hours as needed  for wheezing. 12/14/19   Sharyon Cable, CNM  amLODipine (NORVASC) 5 MG tablet Take 2 tablets (10 mg total) by mouth daily. Patient not taking: Reported on 08/26/2020 06/12/20 08/26/20  Conan Bowens, MD    Allergies    Patient has no known allergies.  Review of Systems   Review of Systems All other systems are reviewed and are negative for acute change except as noted in the HPI.  Physical Exam Updated Vital Signs BP (!) 148/94 (BP Location: Left Arm)   Pulse 84   Temp 98.3 F (36.8 C) (Oral)   Resp 16   Ht 5\' 8"  (1.727 m)   Wt 104.3 kg   LMP 04/25/2021 (Exact Date)   SpO2 98%   BMI 34.97 kg/m   Physical Exam Vitals and nursing note reviewed.  Constitutional:      General: She is not in acute distress.    Appearance: She is not ill-appearing.  HENT:     Head: Normocephalic and atraumatic.     Right Ear: External ear  normal.     Left Ear: External ear normal.     Nose: Nose normal.     Mouth/Throat:     Mouth: Mucous membranes are moist.     Pharynx: Oropharynx is clear.  Eyes:     General: No scleral icterus.       Right eye: No discharge.        Left eye: No discharge.     Extraocular Movements: Extraocular movements intact.     Conjunctiva/sclera: Conjunctivae normal.     Pupils: Pupils are equal, round, and reactive to light.  Neck:     Vascular: No JVD.  Cardiovascular:     Rate and Rhythm: Normal rate and regular rhythm.     Pulses: Normal pulses.          Radial pulses are 2+ on the right side and 2+ on the left side.     Heart sounds: Normal heart sounds.  Pulmonary:     Comments: Lungs clear to auscultation in all fields. Symmetric chest rise. No wheezing, rales, or rhonchi. Abdominal:     Tenderness: There is no right CVA tenderness or left CVA tenderness.     Comments: Abdomen is soft, non-distended, and non-tender in all quadrants. No rigidity, no guarding. No peritoneal signs.  Genitourinary:    Comments: Patient defers pelvic exam Musculoskeletal:        General: Normal range of motion.     Cervical back: Normal range of motion.  Skin:    General: Skin is warm and dry.     Capillary Refill: Capillary refill takes less than 2 seconds.  Neurological:     Mental Status: She is oriented to person, place, and time.     GCS: GCS eye subscore is 4. GCS verbal subscore is 5. GCS motor subscore is 6.     Comments: Fluent speech, no facial droop.  Psychiatric:        Behavior: Behavior normal.    ED Results / Procedures / Treatments   Labs (all labs ordered are listed, but only abnormal results are displayed) Labs Reviewed  BASIC METABOLIC PANEL - Abnormal; Notable for the following components:      Result Value   Glucose, Bld 111 (*)    All other components within normal limits  CBC WITH DIFFERENTIAL/PLATELET  I-STAT BETA HCG BLOOD, ED ([facility], [facility], [facility] ONLY)     EKG None  Radiology 04/27/2021 PELVIC COMPLETE W  TRANSVAGINAL AND TORSION R/O  Result Date: 04/25/2021 CLINICAL DATA:  Initial evaluation for acute pelvic pain. EXAM: TRANSABDOMINAL AND TRANSVAGINAL ULTRASOUND OF PELVIS DOPPLER ULTRASOUND OF OVARIES TECHNIQUE: Both transabdominal and transvaginal ultrasound examinations of the pelvis were performed. Transabdominal technique was performed for global imaging of the pelvis including uterus, ovaries, adnexal regions, and pelvic cul-de-sac. It was necessary to proceed with endovaginal exam following the transabdominal exam to visualize the uterus, endometrium, and ovaries. Color and duplex Doppler ultrasound was utilized to evaluate blood flow to the ovaries. COMPARISON:  None available. FINDINGS: Uterus Measurements: 8.3 x 4.2 x 5.9 cm = volume: 108.7 mL. Uterus is retroverted no discrete fibroid or other mass. Endometrium Thickness: 11.0 mm. No focal abnormality visualized. Trace simple fluid noted within the endocervical canal. Right ovary Measurements: 4.1 x 2.4 x 2.5 cm = volume: 12.6 mL. Normal appearance/no adnexal mass. Left ovary Measurements: 4.0 x 1.8 x 2.2 cm = volume: 8.4 mL. Normal appearance/no adnexal mass. Pulsed Doppler evaluation of both ovaries demonstrates normal low-resistance arterial and venous waveforms. Other findings Small volume free fluid within the pelvis, presumably physiologic. IMPRESSION: 1. Small volume simple fluid within the pelvis, presumably physiologic. 2. Otherwise unremarkable and normal pelvic ultrasound. No evidence for torsion or other acute abnormality. Electronically Signed   By: Rise Mu M.D.   On: 04/25/2021 19:40    Procedures Procedures   Medications Ordered in ED Medications - No data to display  ED Course  I have reviewed the triage vital signs and the nursing notes.  Pertinent labs & imaging results that were available during my care of the patient were reviewed by me and considered in my  medical decision making (see chart for details).    MDM Rules/Calculators/A&P                          History provided by patient with additional history obtained from chart review.     Patient present with irregular vaginal bleeding. She is afebrile, HDS. Work up initiated in triage. Pregnancy test is negative, CBC and BMP overall unremarkable. No anemia, normal platelets.  Abdomen is nontender on exam.  Patient is nontoxic and well-appearing.  With complaint of intermittent pelvic pain and patient has been struggling to find outpatient follow-up pelvic ultrasound ordered to rule out torsion. Ultrasound shows no torsion, ovarian cysts, or fibroids. Patient politely declines pelvic exam as she recently had STD testing and has pain free today. I discussed the reasons for performing the exam today, patient continues to refuse. Serial abdominal exams are benign. Patient given follow-up information for the women's clinic.  No indications for emergent blood transfusion or further work-up at this time.  Patient is agreeable with plan of care.  Discharged home in stable condition.strict return precautions discussed.  Portions of this note were generated with Scientist, clinical (histocompatibility and immunogenetics). Dictation errors may occur despite best attempts at proofreading.   Final Clinical Impression(s) / ED Diagnoses Final diagnoses:  Pelvic pain  Vaginal bleeding    Rx / DC Orders ED Discharge Orders     None        Kandice Hams 04/25/21 1952    Tilden Fossa, MD 04/28/21 1541

## 2021-04-25 NOTE — ED Triage Notes (Signed)
Patient states she has had vaginal bleeding every other week since 03/26/21. Patient states she is currently having vaginal bleeding and bleeding heavier than usual.  Patient denies any abdominal or vaginal pain.

## 2021-04-25 NOTE — ED Provider Notes (Signed)
Emergency Medicine Provider Triage Evaluation Note  Cipriana Biller , a 30 y.o. female  was evaluated in triage.  Pt complains of intermittent vaginal bleeding since 03/26/2021.  Patient states vaginal bleeding has become heavier today.  She admits to using 1 super tampon every 2 hours.  Denies history of heavy menses.  Denies associated abdominal pain, increased vaginal discharge, and dysuria.  She states she had a STD panel 2 weeks ago which was negative.  No fever or chills.  Review of Systems  Positive: Vaginal bleeding Negative: fever  Physical Exam  BP (!) 148/94 (BP Location: Left Arm)   Pulse 84   Temp 98.3 F (36.8 C) (Oral)   Resp 16   Ht 5\' 8"  (1.727 m)   Wt 104.3 kg   LMP 04/25/2021 (Exact Date)   SpO2 98%   BMI 34.97 kg/m  Gen:   Awake, no distress   Resp:  Normal effort  MSK:   Moves extremities without difficulty  Other:    Medical Decision Making  Medically screening exam initiated at 3:15 PM.  Appropriate orders placed.  Emmilyn Crooke was informed that the remainder of the evaluation will be completed by another provider, this initial triage assessment does not replace that evaluation, and the importance of remaining in the ED until their evaluation is complete.    Lacie Draft 04/25/21 1516    04/27/21, MD 04/26/21 [phone]

## 2021-07-01 IMAGING — US US MFM FETAL BPP W/O NON-STRESS
1 series · 14 of 28 positions shown · non-contrast
Comparison: none

[Series 1: us mfm fetal bpp w/o non-stress · 35 acquisitions, 14 frames shown]
[im 2/35]
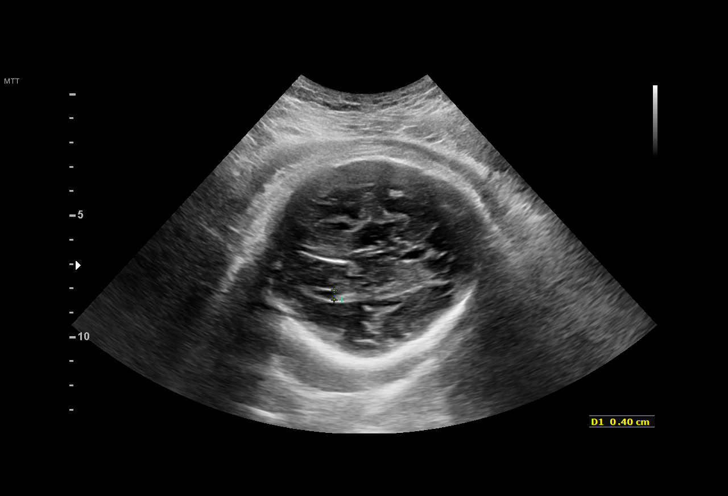
[im 4/35]
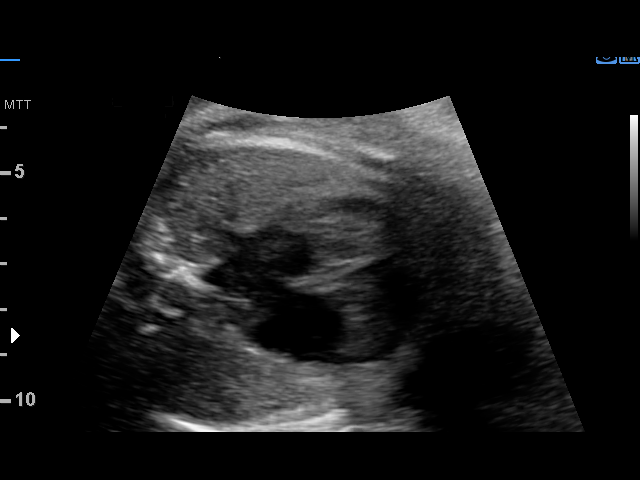
[im 7/35]
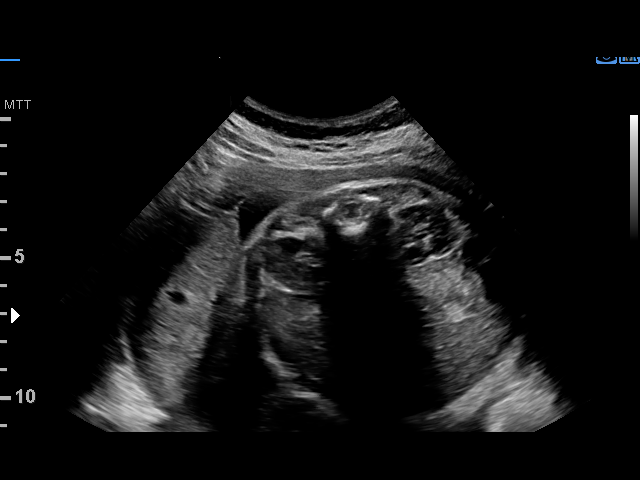
[im 9/35]
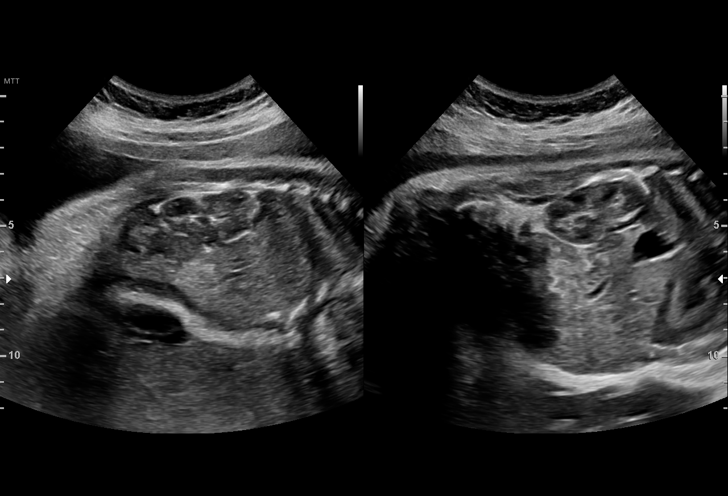
[im 12/35]
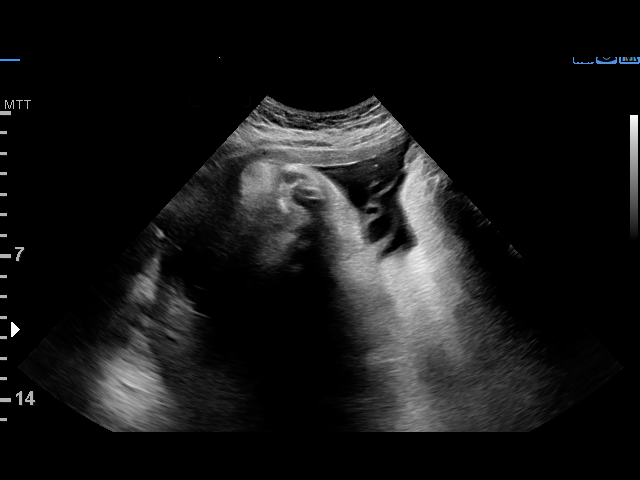
[im 14/35]
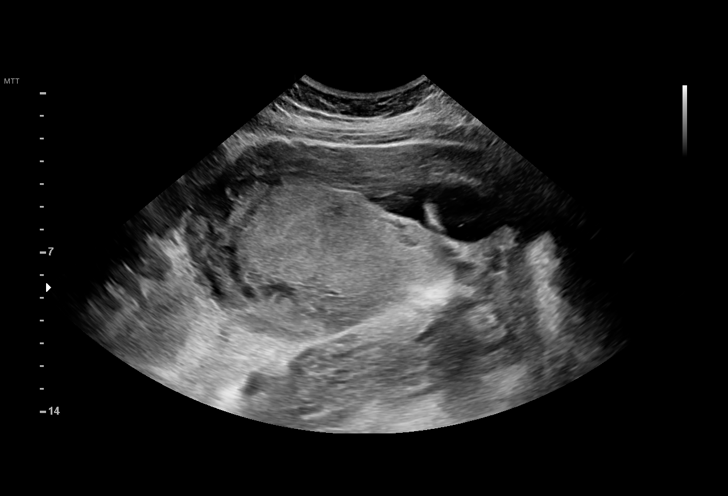
[im 17/35]
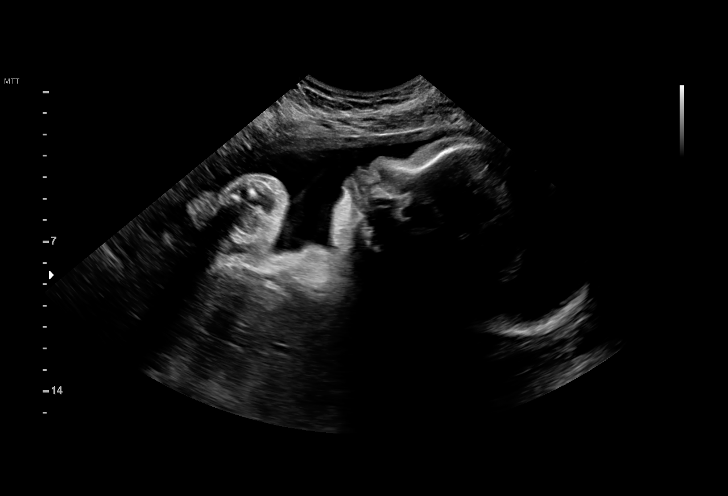
[im 19/35]
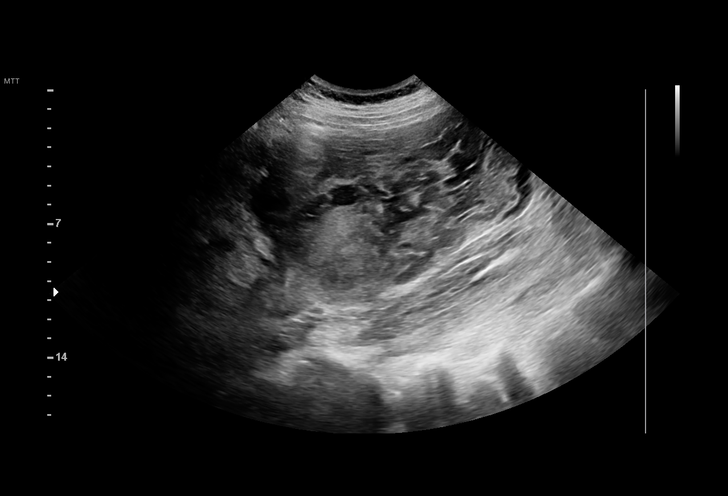
[im 22/35]
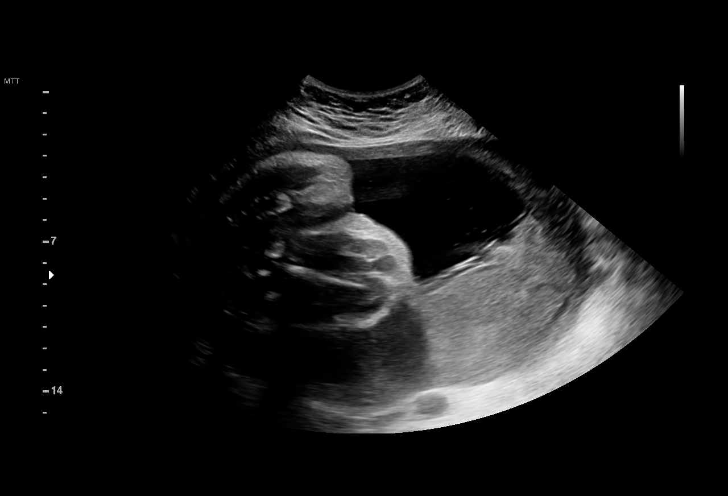
[im 24/35]
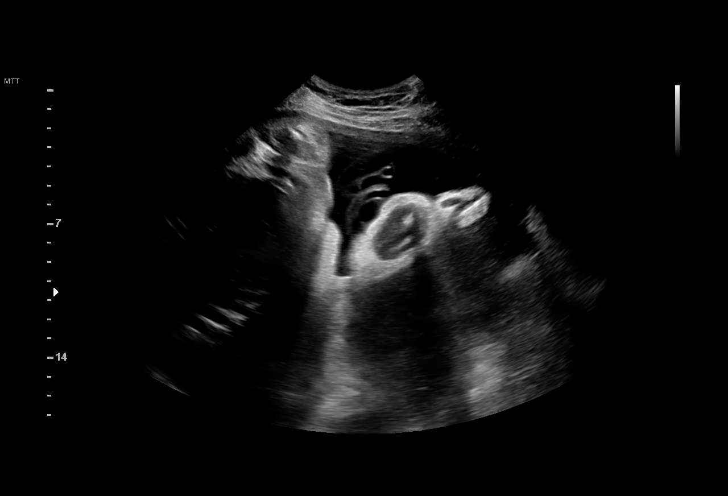
[im 27/35]
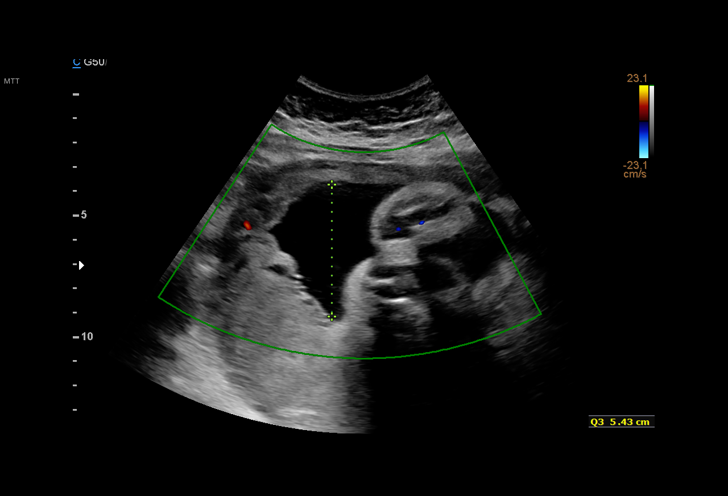
[im 29/35]
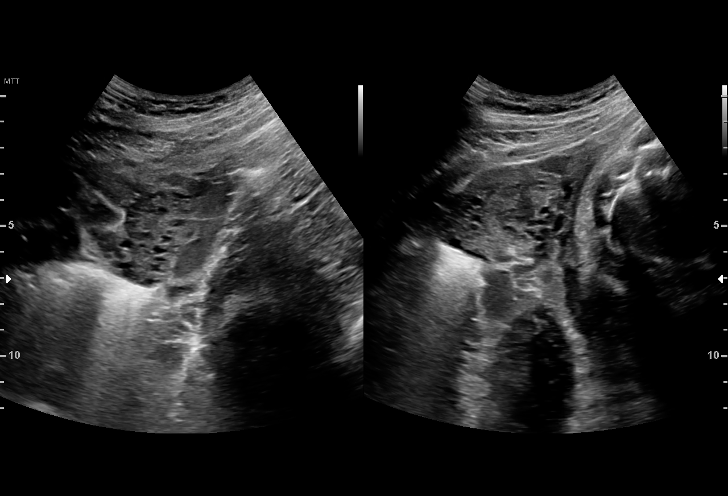
[im 32/35]
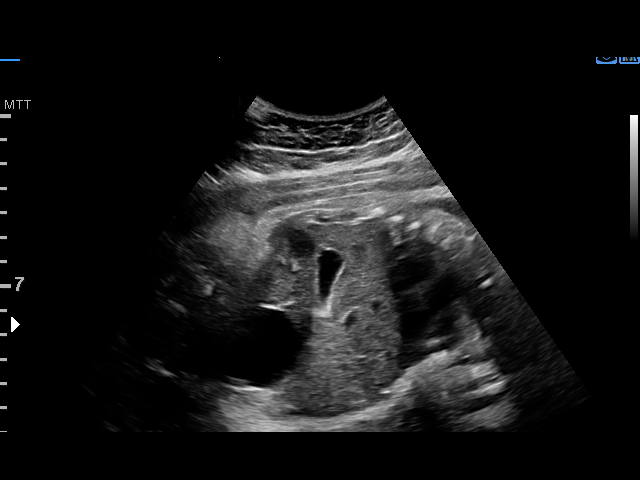
[im 35/35]
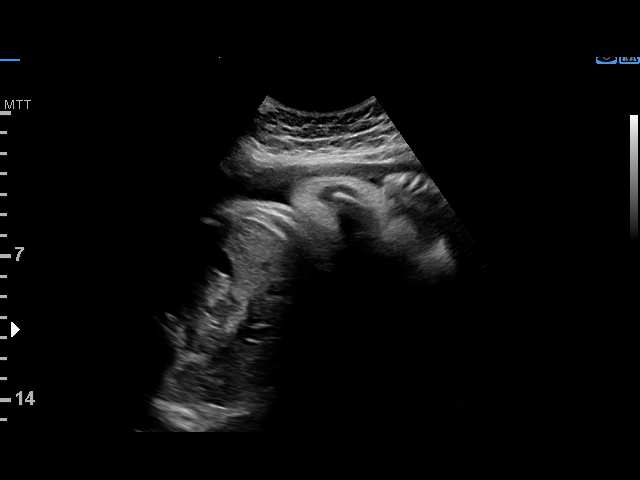

[14 of 28 positions shown; findings below may reference images not displayed]

Indications

 Gestational diabetes in pregnancy,
 controlled by oral hypoglycemic drugs
 33 weeks gestation of pregnancy
 Encounter for other antenatal screening
 follow-up
Fetal Evaluation

 Num Of Fetuses:         1
 Fetal Heart Rate(bpm):  142
 Cardiac Activity:       Observed
 Presentation:           Cephalic
 Placenta:               Posterior
 P. Cord Insertion:      Previously Visualized

 Amniotic Fluid
 AFI FV:      Within normal limits

 AFI Sum(cm)     %Tile       Largest Pocket(cm)
 14.65           52

 RUQ(cm)       RLQ(cm)       LUQ(cm)        LLQ(cm)

Biophysical Evaluation
 Amniotic F.V:   Pocket => 2 cm             F. Tone:        Observed
 F. Movement:    Observed                   Score:          [DATE]
 F. Breathing:   Observed
OB History

 Blood Type:   A+
 Gravidity:    2         Term:   0        Prem:   0        SAB:   0
 TOP:          1       Ectopic:  0        Living: 0
Gestational Age

 Best:          33w 1d     Det. By:  U/S  (02/01/20)          EDD:   06/30/20
Anatomy

 Cranium:               Previously seen        Aortic Arch:            Previously seen
 Cavum:                 Previously seen        Ductal Arch:            Previously seen
 Ventricles:            Appears normal         Diaphragm:              Appears normal
 Choroid Plexus:        Previously seen        Stomach:                Appears normal, left
                                                                       sided
 Cerebellum:            Previously seen        Abdomen:                Previously seen
 Posterior Fossa:       Previously seen        Abdominal Wall:         Previously seen
 Nuchal Fold:           Not applicable (>20    Cord Vessels:           Previously seen
                        wks GA)
 Face:                  Orbits previously      Kidneys:                Appear normal
                        seen
 Lips:                  Previously seen        Bladder:                Appears normal
 Thoracic:              Previously seen        Spine:                  Previously seen
 Heart:                 Appears normal         Upper Extremities:      Previously seen
                        (4CH, axis, and
                        situs)
 RVOT:                  Previously seen        Lower Extremities:      Previously seen
 LVOT:                  Previously seen

 Other:  Female gender visualized prev. Nasal bone and Open hands
         visualized prev. Heels and 5th digit visualized prev.   Technically
         difficult due to fetal position.
Cervix Uterus Adnexa

 Cervix
 Normal appearance by transabdominal scan.

 Uterus
 No abnormality visualized.

 Right Ovary
 No adnexal mass visualized.

 Left Ovary
 No adnexal mass visualized.

 Cul De Sac
 No free fluid seen.
 Adnexa
 No abnormality visualized.
Comments

 This patient was seen for a biophysical profile due to
 gestational diabetes that is currently treated with Metformin.
 She reports that her fingerstick values have mostly been
 within normal limits.  She denies any problems since her last
 exam.
 A biophysical profile performed today was [DATE].
 There was normal amniotic fluid noted on today's ultrasound
 exam.
 Another biophysical profile was scheduled in 1 week.

## 2022-06-13 IMAGING — US US PELVIS COMPLETE TRANSABD/TRANSVAG W DUPLEX
1 series · 15 of 25 positions shown · non-contrast
Comparison: None available.

CLINICAL DATA: Initial evaluation for acute pelvic pain.

EXAM:
TRANSABDOMINAL AND TRANSVAGINAL ULTRASOUND OF PELVIS
DOPPLER ULTRASOUND OF OVARIES
TECHNIQUE: Both transabdominal and transvaginal ultrasound examinations of the
pelvis were performed. Transabdominal technique was performed for
global imaging of the pelvis including uterus, ovaries, adnexal
regions, and pelvic cul-de-sac.
It was necessary to proceed with endovaginal exam following the
transabdominal exam to visualize the uterus, endometrium, and
ovaries. Color and duplex Doppler ultrasound was utilized to
evaluate blood flow to the ovaries.

[Series 1: us transvaginal non-ob mc & wl · 15 of 87 slices shown]
[im 1/87]
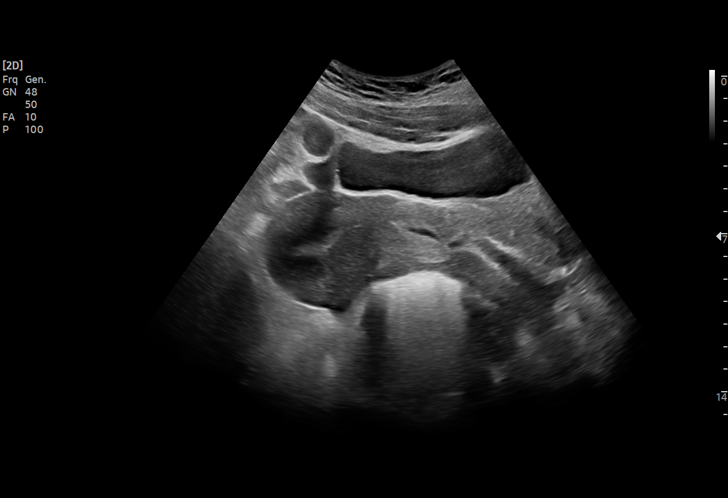
[im 8/87]
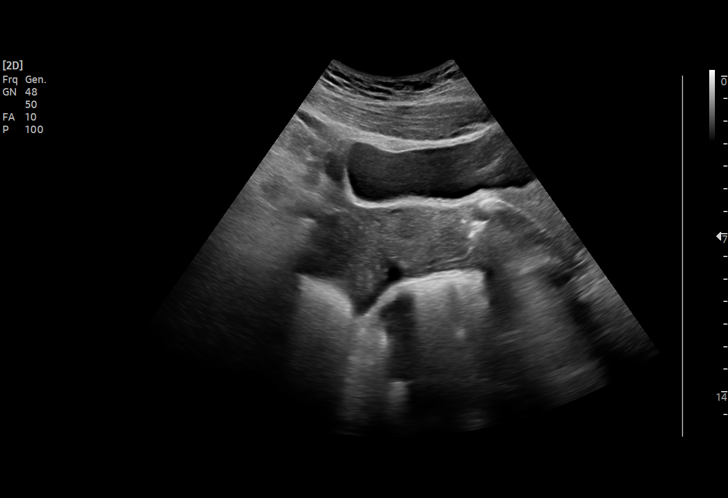
[im 15/87]
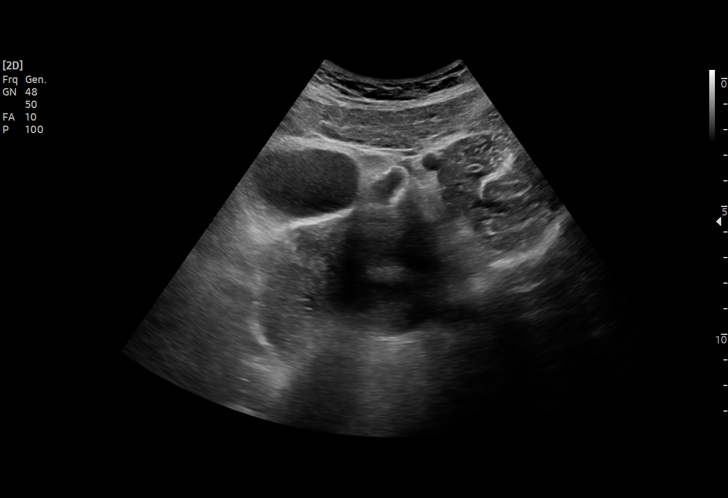
[im 18/87]
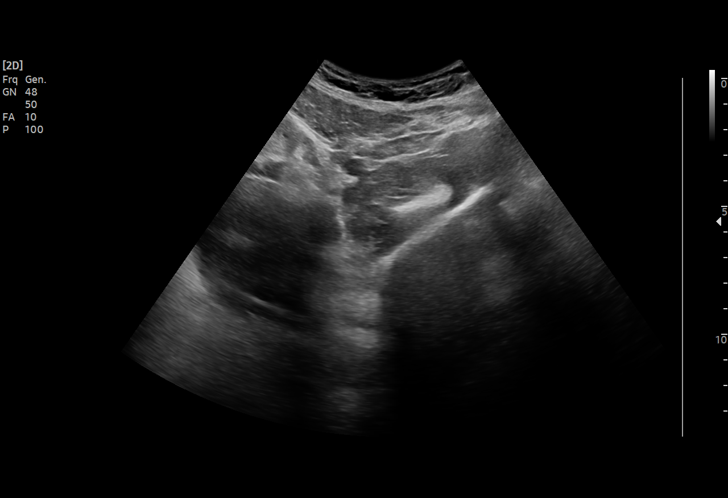
[im 26/87]
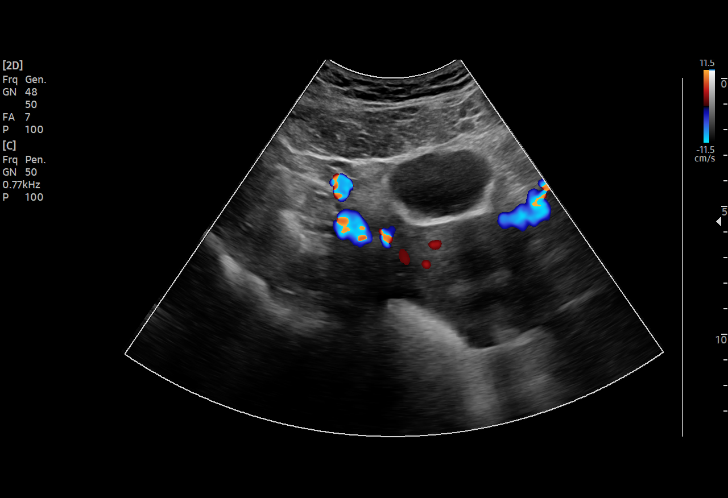
[im 33/87]
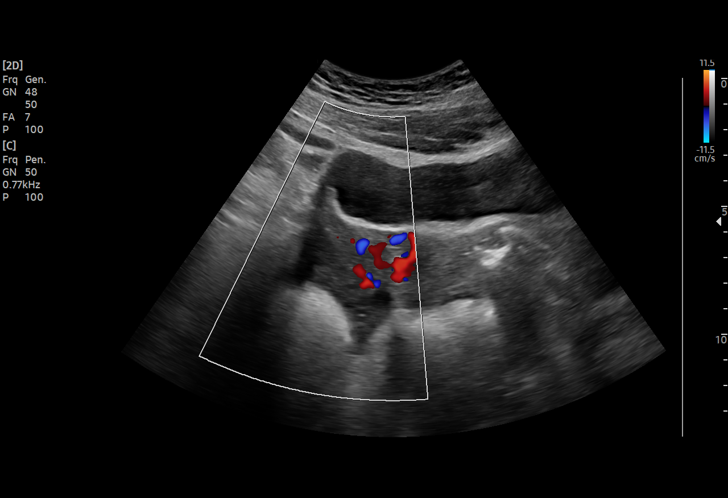
[im 36/87]
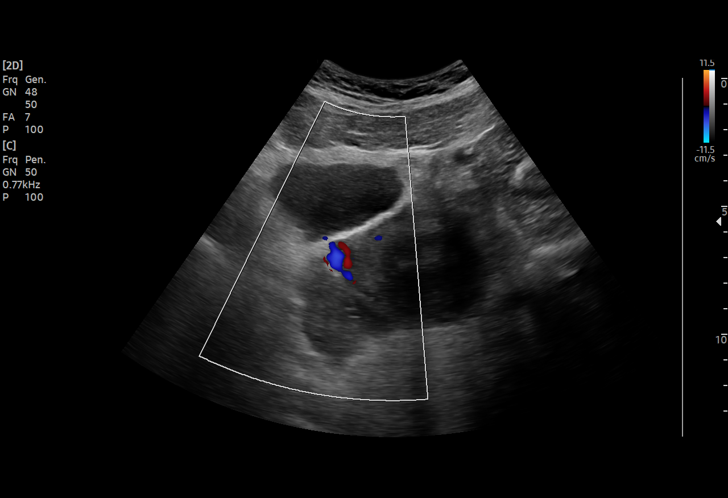
[im 44/87]
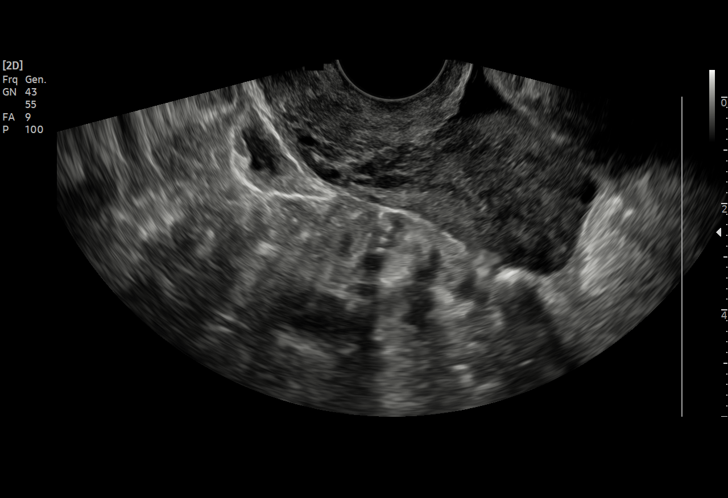
[im 51/87]
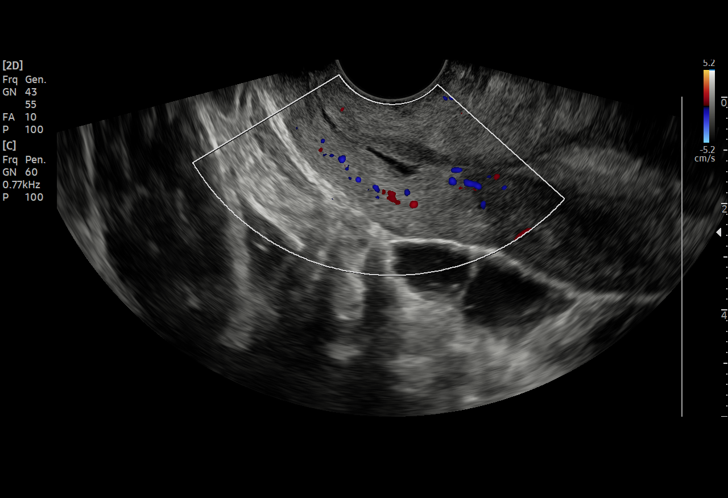
[im 54/87]
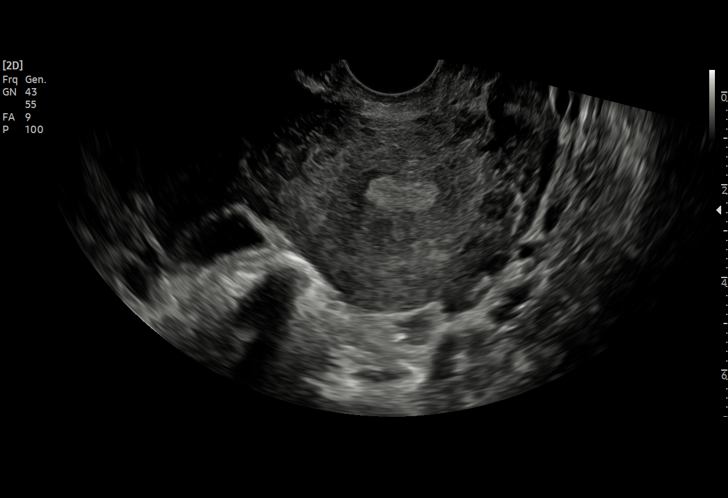
[im 61/87]
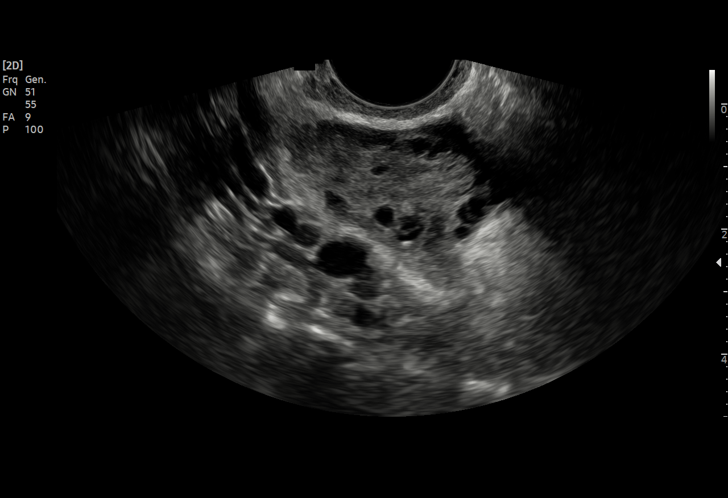
[im 69/87]
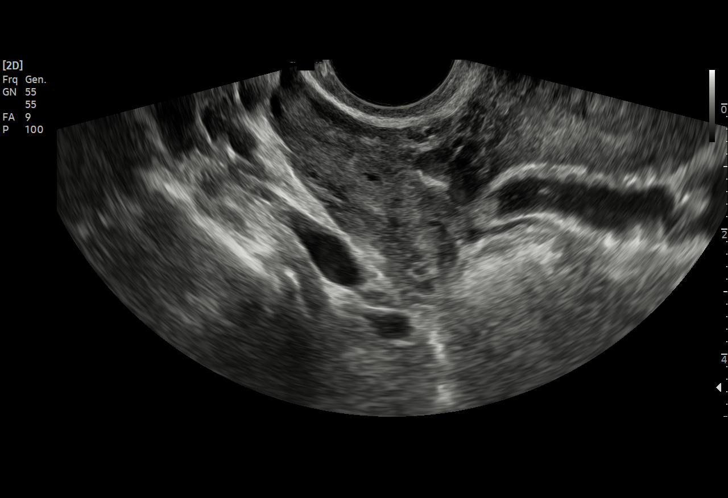
[im 72/87]
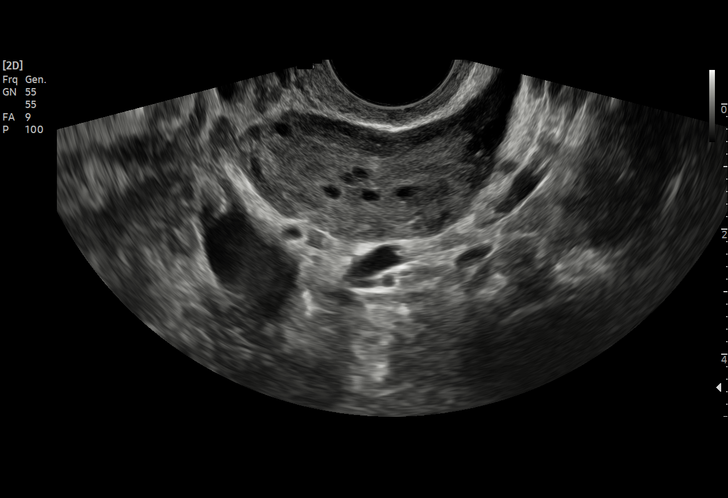
[im 79/87]
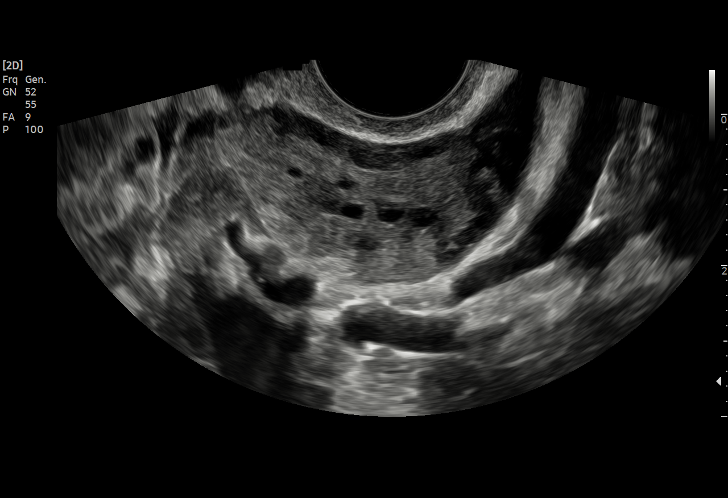
[im 87/87]
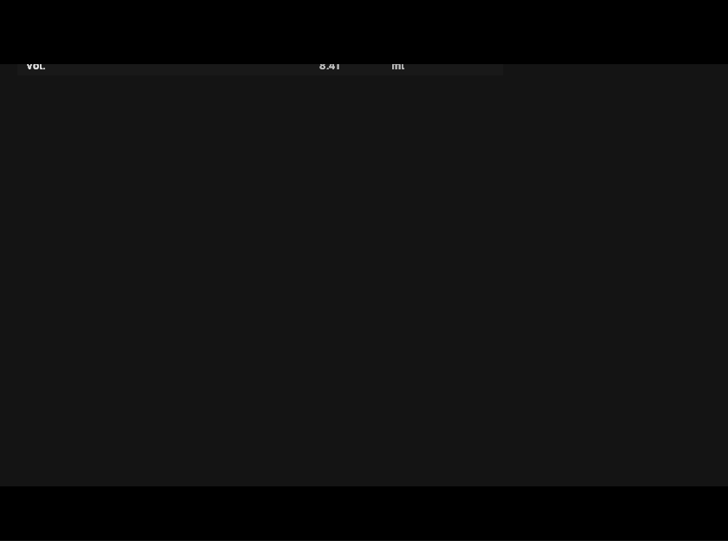

[15 of 25 positions shown; findings below may reference images not displayed]

FINDINGS: Uterus

Measurements: 8.3 x 4.2 x 5.9 cm = volume: 108.7 mL. Uterus is
retroverted no discrete fibroid or other mass.

Endometrium

Thickness: 11.0 mm. No focal abnormality visualized. Trace simple
fluid noted within the endocervical canal.

Right ovary

Measurements: 4.1 x 2.4 x 2.5 cm = volume: 12.6 mL. Normal
appearance/no adnexal mass.

Left ovary

Measurements: 4.0 x 1.8 x 2.2 cm = volume: 8.4 mL. Normal
appearance/no adnexal mass.

Pulsed Doppler evaluation of both ovaries demonstrates normal
low-resistance arterial and venous waveforms.

Other findings

Small volume free fluid within the pelvis, presumably physiologic.
IMPRESSION: 1. Small volume simple fluid within the pelvis, presumably
physiologic.
2. Otherwise unremarkable and normal pelvic ultrasound. No evidence
for torsion or other acute abnormality.

## 2024-02-26 ENCOUNTER — Encounter (HOSPITAL_COMMUNITY): Payer: Self-pay | Admitting: *Deleted

## 2024-02-26 ENCOUNTER — Emergency Department (HOSPITAL_COMMUNITY)
Admission: EM | Admit: 2024-02-26 | Discharge: 2024-02-26 | Discharge status: Against Medical Advice | Attending: Emergency Medicine | Admitting: Emergency Medicine

## 2024-02-26 ENCOUNTER — Other Ambulatory Visit: Payer: Self-pay

## 2024-02-26 DIAGNOSIS — Z5321 Procedure and treatment not carried out due to patient leaving prior to being seen by health care provider: Secondary | ICD-10-CM | POA: Diagnosis not present

## 2024-02-26 DIAGNOSIS — J45909 Unspecified asthma, uncomplicated: Secondary | ICD-10-CM | POA: Diagnosis not present

## 2024-02-26 DIAGNOSIS — R0600 Dyspnea, unspecified: Secondary | ICD-10-CM | POA: Insufficient documentation

## 2024-02-26 DIAGNOSIS — J4521 Mild intermittent asthma with (acute) exacerbation: Secondary | ICD-10-CM

## 2024-02-26 MED ORDER — ALBUTEROL SULFATE (2.5 MG/3ML) 0.083% IN NEBU
5.0000 mg | INHALATION_SOLUTION | Freq: Once | RESPIRATORY_TRACT | Status: AC
  Administered 2024-02-26: 5 mg via RESPIRATORY_TRACT
  Filled 2024-02-26: qty 6

## 2024-02-26 NOTE — ED Provider Notes (Signed)
 I went to evaluate the patient patient was not in her room.  Per nursing staff they state when they were hooking her up to the monitor she stated that she got her nebulizer treatment and that is all she was here for.  Unable to comment on history, physical or MDM given I did not evaluate the patient.  Therefore she eloped from the emergency department.   Arminda Landmark, MD 02/26/24 2024    Dalene Duck, MD 02/27/24 [phone]

## 2024-02-26 NOTE — ED Notes (Signed)
 PT has taken all leads off and unhooked herself from the monitor. PT and her family is nowhere to be found.

## 2024-02-26 NOTE — ED Triage Notes (Signed)
 The pt is c/o difficulty breathing  she has asthma and her inhaler has not been helping her she wants a breathing treatment.  No audible wheezes at present no difficulty breathing  lmp  April 7th

## 2024-10-18 ENCOUNTER — Encounter: Payer: Self-pay | Admitting: Physical Therapy

## 2024-10-18 ENCOUNTER — Other Ambulatory Visit: Payer: Self-pay

## 2024-10-18 ENCOUNTER — Ambulatory Visit: Attending: Orthopedic Surgery | Admitting: Physical Therapy

## 2024-10-18 DIAGNOSIS — M79605 Pain in left leg: Secondary | ICD-10-CM | POA: Diagnosis present

## 2024-10-18 DIAGNOSIS — M25552 Pain in left hip: Secondary | ICD-10-CM | POA: Diagnosis present

## 2024-10-18 NOTE — Therapy (Signed)
 " OUTPATIENT PHYSICAL THERAPY THORACOLUMBAR EVALUATION   Patient Name: Rachel Neal MRN: [id number] DOB:04/01/1991, 34 y.o., female Today's Date: 10/18/2024  END OF SESSION:  PT End of Session - 10/18/24 1537     Visit Number 1          Past Medical History:  Diagnosis Date   Asthma    Gestational diabetes    Hx of chlamydia infection    Hypertension    Low lying placenta, antepartum 02/02/2020   Pregnancy induced hypertension    Past Surgical History:  Procedure Laterality Date   JOINT REPLACEMENT     KNEE SURGERY Left    Patient Active Problem List   Diagnosis Date Noted   Gestational hypertension 06/07/2020   Vaginal delivery 06/07/2020   Shoulder dystocia during labor and delivery 06/07/2020   Gestational diabetes mellitus (GDM) in third trimester controlled on oral hypoglycemic drug 04/04/2020   Supervision of other normal pregnancy, antepartum 12/14/2019   Asthma affecting pregnancy, antepartum 12/14/2019    PCP: no PCP per patient  REFERRING PROVIDER: Yvone Rush, MD  REFERRING DIAG:  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 After 3-4 months pt states that she felt symptoms were improving and she felt an overstretch moment. Saw MD in July and was dx with piriformis syndrome. Pt reports symptoms are constant in nature and range in intensity from 3/10-8/10 depending on activity or position, more sore in the morning and improves as the day progresses, worsens at the end of the day. Pt works from home, has  been trying to get up and change position every 3-4 hours. Has been laying prone with pillow under hips which helps. LE symptoms to the L foot are constant in nature as well.   PERTINENT HISTORY:  PIRIFORMIS SYNDROME LEFT SIDE   Injection completed at referring MD visit PAIN:  Are you having pain? Yes: NPRS scale: 3-4/10 Pain location: left hip, LLE to the foot Pain description: ache, sharp Aggravating factors: sitting, prolonged positioning Relieving factors: standing, changing positions, laying prone  PRECAUTIONS: None  RED FLAGS: None   WEIGHT BEARING RESTRICTIONS: No  FALLS:  Has  patient fallen in last 6 months? No  LIVING ENVIRONMENT: Lives with: lives with their family Lives in: House/apartment Stairs: No Has following equipment at home: None  OCCUPATION: desk job, works from home  PLOF: Independent  PATIENT GOALS: reduce pain, back to exercising   NEXT MD VISIT: 11/14/24  OBJECTIVE:  Note: Objective measures were completed at Evaluation unless otherwise noted.  DIAGNOSTIC FINDINGS:  None noted in chart relative to referring dx  PATIENT SURVEYS:  LEFS  Extreme difficulty/unable (0), Quite a bit of difficulty (1), Moderate difficulty (2), Little difficulty (3), No difficulty (4) Survey date:  10/18/24  Any of your usual work, housework or school activities 3  2. Usual hobbies, recreational or sporting activities 3  3. Getting into/out of the bath 4  4. Walking between rooms 3  5. Putting on socks/shoes 2  6. Squatting  2  7. Lifting an object, like a bag of groceries from the floor 3  8. Performing light activities around your home 3  9. Performing heavy activities around your home 3  10. Getting into/out of a car 2  11. Walking 2 blocks 4  12. Walking 1 mile 4  13. Going up/down 10 stairs (1 flight) 4  14. Standing for 1 hour 4  15.  sitting for 1 hour 2  16. Running on even ground 3  17. Running on uneven ground 3  18. Making sharp turns while  running fast 2  19. Hopping  3  20. Rolling over in bed 3  Score total:  60/80     COGNITION: Overall cognitive status: Within functional limits for tasks assessed     SENSATION: WFL  Asymmetric fig 4 position L vs R, limited trunk flexion and inc symptoms    POSTURE: rounded shoulders and forward head  PALPATION: Inc resting tension of the right glutes, TTP in L piriformis  LUMBAR ROM:   AROM eval  Flexion 25% loss, pain in posterior LLE to the knee  Extension nil loss  Right lateral flexion Nil  loss  Left lateral flexion nil loss  Right rotation   Left rotation    (Blank rows = not tested)  LOWER EXTREMITY ROM:     Active  Right eval Left eval  Hip flexion    Hip extension    Hip abduction    Hip adduction    Hip internal rotation WNL WNL  Hip external rotation 45 38  Knee flexion    Knee extension    Ankle dorsiflexion    Ankle plantarflexion    Ankle inversion    Ankle eversion     (Blank rows = not tested)  LOWER EXTREMITY MMT:    MMT Right eval Left eval  Hip flexion 5/5 5/5  Hip extension    Hip abduction 4+/5 4/5  Hip adduction 4+/5 4+/5 P  Hip internal rotation    Hip external rotation    Knee flexion 5/5 4/5P  Knee extension 5/5 4-/5  Ankle dorsiflexion 5/5 5/5  Ankle plantarflexion 5/5 5/5  Ankle inversion    Ankle eversion     (Blank rows = not tested)  SLR TEST:  RLE 85 degrees LLE 40 degrees   TREATMENT DATE:   10/18/24- EVAL Sciatic nerve glide in sitting BLE, seated figure 4 piriformis stretch BLE, self pirformis release provided via HEP. Educated on timing and use of heat in combination with rx interventions.  PATIENT EDUCATION:  Education details: Pt educated on relevant anatomy, physiology, pathology, diagnosis, prognosis, progression of care, pain and activity modification related to left  hip pain Person educated: Patient Education method: Explanation, Demonstration, and Handouts Education comprehension: verbalized understanding and returned demonstration  HOME EXERCISE PROGRAM: Access Code: [email or web address] URL: [email or web address] Date: 10/18/2024 Prepared by: Stann Ohara  Exercises - Seated Slump Nerve Glide  - 1-2 x daily - 7 x weekly - 3 sets - 15 reps - 2 hold - Seated Figure 4 Piriformis Stretch  - 1-2 x daily - 7 x weekly - 1 sets - 30 reps - 2 hold - Seated Correct Posture  - 1-5 x daily - 7 x weekly - 1 sets - 1 reps - 1.5-6mins hold  ASSESSMENT:  CLINICAL IMPRESSION: Patient is a 34 y.o. F who was seen today for physical therapy evaluation and treatment for left hip pain. Pt symptoms are consistent with perpetual tightness of the left piriformis likely with sciatic nerve impingement between or deep to muscle belly with radicular symptoms. Pt stands to benefit from continued skilled physical therapy to address deficit areas and restore safety with activities and participations at home and in the community.     OBJECTIVE IMPAIRMENTS: decreased activity tolerance, decreased endurance, decreased ROM, decreased strength, increased fascial restrictions, impaired perceived functional ability, increased muscle spasms, impaired sensation, and pain.   ACTIVITY LIMITATIONS: carrying, lifting, sitting, and sleeping  PARTICIPATION LIMITATIONS: cleaning, laundry, driving, occupation, and yard work  PERSONAL FACTORS: Past/current experiences, Profession, and Time since onset of injury/illness/exacerbation are also affecting patient's functional outcome.   REHAB POTENTIAL: Excellent  CLINICAL DECISION MAKING: Stable/uncomplicated  EVALUATION COMPLEXITY: Low   GOALS: Goals reviewed with patient? Yes  SHORT TERM GOALS: Target date: 11/22/24   Pt will report compliance with HEP to work towards ind and home management strategies Baseline: Goal status:  INITIAL   2.  Pt will score no less than 70/80 on LEFS to demonstrate improved activity tolerance Baseline:  Goal status: INITIAL   3.  Pt will improve lumbar spine and L hip ROM to full and painless in order to demonstrate progress towards activity tolerance and improved function Baseline:  Goal status: INITIAL     LONG TERM GOALS: Target date: 12/20/24   Pt will score no less than 78/80 on LEFS to demonstrate improved activity tolerance Baseline:  Goal status: INITIAL   2.  Pt will report no greater than 0/10 pain over 7 consecutive days to demonstrate maintained reduction in symptoms and improved tolerance to activity Baseline:  Goal status: INITIAL   3.  Pt will be ind in the management of their symptoms at home and in the community Baseline:  Goal status: INITIAL      PLAN:  PT FREQUENCY: 1-2x/week  PT DURATION: 8 weeks  PLANNED INTERVENTIONS: 97110-Therapeutic exercises, 97530- Therapeutic activity, [id number]- Neuromuscular re-education, 97535- Self Care, 02859- Manual therapy, G0283- Electrical stimulation (unattended), 97016- Vasopneumatic device, 20560 (1-2 muscles), 20561 (3+ muscles)- Dry Needling, Patient/Family education, Cryotherapy, and Moist heat.  PLAN FOR NEXT SESSION: modify HEP as needed, progress lower quarter strength, stability, endurance, and motor control through functional movement patterns   Stann DELENA Ohara, PT 10/18/2024, 3:39 PM  "

## 2024-11-03 ENCOUNTER — Ambulatory Visit

## 2024-11-07 ENCOUNTER — Ambulatory Visit: Admitting: Physical Therapy

## 2024-11-08 NOTE — Therapy (Incomplete)
 " OUTPATIENT PHYSICAL THERAPY THORACOLUMBAR EVALUATION   Patient Name: Rachel Neal MRN: [id number] DOB:07/23/1991, 34 y.o., female Today's Date: 11/08/2024  END OF SESSION:    Past Medical History:  Diagnosis Date   Asthma    Gestational diabetes    Hx of chlamydia infection    Hypertension    Low lying placenta, antepartum 02/02/2020   Pregnancy induced hypertension    Past Surgical History:  Procedure Laterality Date   JOINT REPLACEMENT     KNEE SURGERY Left    Patient Active Problem List   Diagnosis Date Noted   Gestational hypertension 06/07/2020   Vaginal delivery 06/07/2020   Shoulder dystocia during labor and delivery 06/07/2020   Gestational diabetes mellitus (GDM) in third trimester controlled on oral hypoglycemic drug 04/04/2020   Supervision of other normal pregnancy, antepartum 12/14/2019   Asthma affecting pregnancy, antepartum 12/14/2019    PCP: no PCP per patient  REFERRING PROVIDER: Yvone Rush, MD  REFERRING DIAG:  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  ONSET DATE: January 2025  SUBJECTIVE:                                                                                                                                                                                           SUBJECTIVE STATEMENT: ***  EVAL:  Pt states that she noted onset of left hip pain which felt like a pulled muscle, did not go away. After 3-4 months pt states that she felt symptoms were improving and she felt an overstretch moment. Saw MD in July and was dx with piriformis syndrome. Pt reports symptoms are constant in nature and range in intensity from 3/10-8/10 depending on activity or position, more sore in the morning and improves as the day progresses, worsens at the end of the day. Pt works from home, has been trying to get up and change position every  3-4 hours. Has been laying prone with pillow under hips which helps. LE symptoms to the L foot are constant in nature as well.   PERTINENT HISTORY:  PIRIFORMIS SYNDROME LEFT SIDE   Injection completed at referring MD visit PAIN:  Are you having pain? Yes: NPRS scale: 3-4/10 Pain location: left hip, LLE to the foot Pain description: ache, sharp Aggravating factors: sitting, prolonged positioning Relieving factors: standing, changing positions, laying prone  PRECAUTIONS: None  RED FLAGS: None   WEIGHT BEARING RESTRICTIONS: No  FALLS:  Has patient fallen in last 6 months? No  LIVING ENVIRONMENT: Lives with: lives with their family Lives in:  House/apartment Stairs: No Has following equipment at home: None  OCCUPATION: desk job, works from home  PLOF: Independent  PATIENT GOALS: reduce pain, back to exercising   NEXT MD VISIT: 11/14/24  OBJECTIVE:  Note: Objective measures were completed at Evaluation unless otherwise noted.  DIAGNOSTIC FINDINGS:  None noted in chart relative to referring dx  PATIENT SURVEYS:  LEFS  Extreme difficulty/unable (0), Quite a bit of difficulty (1), Moderate difficulty (2), Little difficulty (3), No difficulty (4) Survey date:  10/18/24  Any of your usual work, housework or school activities 3  2. Usual hobbies, recreational or sporting activities 3  3. Getting into/out of the bath 4  4. Walking between rooms 3  5. Putting on socks/shoes 2  6. Squatting  2  7. Lifting an object, like a bag of groceries from the floor 3  8. Performing light activities around your home 3  9. Performing heavy activities around your home 3  10. Getting into/out of a car 2  11. Walking 2 blocks 4  12. Walking 1 mile 4  13. Going up/down 10 stairs (1 flight) 4  14. Standing for 1 hour 4  15.  sitting for 1 hour 2  16. Running on even ground 3  17. Running on uneven ground 3  18. Making sharp turns while running fast 2  19. Hopping  3  20. Rolling over in  bed 3  Score total:  60/80     COGNITION: Overall cognitive status: Within functional limits for tasks assessed     SENSATION: WFL  Asymmetric fig 4 position L vs R, limited trunk flexion and inc symptoms    POSTURE: rounded shoulders and forward head  PALPATION: Inc resting tension of the right glutes, TTP in L piriformis  LUMBAR ROM:   AROM eval  Flexion 25% loss, pain in posterior LLE to the knee  Extension nil loss  Right lateral flexion Nil  loss  Left lateral flexion nil loss  Right rotation   Left rotation    (Blank rows = not tested)  LOWER EXTREMITY ROM:     Active  Right eval Left eval  Hip flexion    Hip extension    Hip abduction    Hip adduction    Hip internal rotation WNL WNL  Hip external rotation 45 38  Knee flexion    Knee extension    Ankle dorsiflexion    Ankle plantarflexion    Ankle inversion    Ankle eversion     (Blank rows = not tested)  LOWER EXTREMITY MMT:    MMT Right eval Left eval  Hip flexion 5/5 5/5  Hip extension    Hip abduction 4+/5 4/5  Hip adduction 4+/5 4+/5 P  Hip internal rotation    Hip external rotation    Knee flexion 5/5 4/5P  Knee extension 5/5 4-/5  Ankle dorsiflexion 5/5 5/5  Ankle plantarflexion 5/5 5/5  Ankle inversion    Ankle eversion     (Blank rows = not tested)  SLR TEST:  RLE 85 degrees LLE 40 degrees   TREATMENT DATE:   [facility] Adult PT Treatment:                                                DATE: 11/09/24 Therapeutic Exercise: *** Manual Therapy: *** Neuromuscular re-ed: *** Therapeutic Activity: ***  Modalities: *** Self Care: ***   10/18/24- EVAL Sciatic nerve glide in sitting BLE, seated figure 4 piriformis stretch BLE, self pirformis release provided via HEP. Educated on timing and use of heat in combination with rx interventions.                                                                                                                                   PATIENT  EDUCATION:  Education details: Pt educated on relevant anatomy, physiology, pathology, diagnosis, prognosis, progression of care, pain and activity modification related to left hip pain Person educated: Patient Education method: Explanation, Demonstration, and Handouts Education comprehension: verbalized understanding and returned demonstration  HOME EXERCISE PROGRAM: Access Code: [email or web address] URL: [email or web address] Date: 10/18/2024 Prepared by: Stann Ohara  Exercises - Seated Slump Nerve Glide  - 1-2 x daily - 7 x weekly - 3 sets - 15 reps - 2 hold - Seated Figure 4 Piriformis Stretch  - 1-2 x daily - 7 x weekly - 1 sets - 30 reps - 2 hold - Seated Correct Posture  - 1-5 x daily - 7 x weekly - 1 sets - 1 reps - 1.5-70mins hold  ASSESSMENT:  CLINICAL IMPRESSION: ***  EVAL: 10/18/24 Patient is a 34 y.o. F who was seen today for physical therapy evaluation and treatment for left hip pain. Pt symptoms are consistent with perpetual tightness of the left piriformis likely with sciatic nerve impingement between or deep to muscle belly with radicular symptoms. Pt stands to benefit from continued skilled physical therapy to address deficit areas and restore safety with activities and participations at home and in the community.     OBJECTIVE IMPAIRMENTS: decreased activity tolerance, decreased endurance, decreased ROM, decreased strength, increased fascial restrictions, impaired perceived functional ability, increased muscle spasms, impaired sensation, and pain.   ACTIVITY LIMITATIONS: carrying, lifting, sitting, and sleeping  PARTICIPATION LIMITATIONS: cleaning, laundry, driving, occupation, and yard work  PERSONAL FACTORS: Past/current experiences, Profession, and Time since onset of injury/illness/exacerbation are also affecting patient's functional outcome.   REHAB POTENTIAL: Excellent  CLINICAL DECISION MAKING: Stable/uncomplicated  EVALUATION COMPLEXITY:  Low   GOALS: Goals reviewed with patient? Yes  SHORT TERM GOALS: Target date: 11/22/24   Pt will report compliance with HEP to work towards ind and home management strategies Baseline: Goal status: INITIAL   2.  Pt will score no less than 70/80 on LEFS to demonstrate improved activity tolerance Baseline:  Goal status: INITIAL   3.  Pt will improve lumbar spine and L hip ROM to full and painless in order to demonstrate progress towards activity tolerance and improved function Baseline:  Goal status: INITIAL     LONG TERM GOALS: Target date: 12/20/24   Pt will score no less than 78/80 on LEFS to demonstrate improved activity tolerance Baseline:  Goal status: INITIAL   2.  Pt will report no greater than 0/10 pain over 7 consecutive days to demonstrate maintained  reduction in symptoms and improved tolerance to activity Baseline:  Goal status: INITIAL   3.  Pt will be ind in the management of their symptoms at home and in the community Baseline:  Goal status: INITIAL      PLAN:  PT FREQUENCY: 1-2x/week  PT DURATION: 8 weeks  PLANNED INTERVENTIONS: 97110-Therapeutic exercises, 97530- Therapeutic activity, [id number]- Neuromuscular re-education, 97535- Self Care, 02859- Manual therapy, G0283- Electrical stimulation (unattended), 97016- Vasopneumatic device, 20560 (1-2 muscles), 20561 (3+ muscles)- Dry Needling, Patient/Family education, Cryotherapy, and Moist heat.  PLAN FOR NEXT SESSION: modify HEP as needed, progress lower quarter strength, stability, endurance, and motor control through functional movement patterns   Stann DELENA Ohara, PT 11/08/2024, 3:54 PM  "

## 2024-11-09 ENCOUNTER — Ambulatory Visit: Admitting: Physical Therapy

## 2024-11-13 ENCOUNTER — Ambulatory Visit

## 2024-11-15 ENCOUNTER — Ambulatory Visit

## 2024-11-15 DIAGNOSIS — M25552 Pain in left hip: Secondary | ICD-10-CM

## 2024-11-15 DIAGNOSIS — M79605 Pain in left leg: Secondary | ICD-10-CM

## 2024-11-21 ENCOUNTER — Ambulatory Visit

## 2024-11-23 ENCOUNTER — Ambulatory Visit
